# Patient Record
Sex: Male | Born: 1985
Health system: Southern US, Community
[De-identification: ages and names within clinical notes are randomized; demographics above are authoritative.]

## PROBLEM LIST (undated history)

## (undated) DIAGNOSIS — F329 Major depressive disorder, single episode, unspecified: Secondary | ICD-10-CM

## (undated) DIAGNOSIS — T7840XA Allergy, unspecified, initial encounter: Secondary | ICD-10-CM

## (undated) DIAGNOSIS — A630 Anogenital (venereal) warts: Secondary | ICD-10-CM

## (undated) DIAGNOSIS — F32A Depression, unspecified: Secondary | ICD-10-CM

## (undated) HISTORY — DX: Anogenital (venereal) warts: A63.0

## (undated) HISTORY — DX: Depression, unspecified: F32.A

## (undated) HISTORY — DX: Major depressive disorder, single episode, unspecified: F32.9

## (undated) HISTORY — DX: Allergy, unspecified, initial encounter: T78.40XA

---

## 2004-01-15 HISTORY — PX: OTHER SURGICAL HISTORY: SHX169

## 2004-02-22 ENCOUNTER — Ambulatory Visit: Payer: Self-pay | Admitting: Family Medicine

## 2004-04-20 ENCOUNTER — Ambulatory Visit: Payer: Self-pay | Admitting: Family Medicine

## 2004-08-10 ENCOUNTER — Ambulatory Visit: Payer: Self-pay | Admitting: Family Medicine

## 2004-08-17 ENCOUNTER — Ambulatory Visit: Payer: Self-pay | Admitting: Family Medicine

## 2006-06-11 ENCOUNTER — Ambulatory Visit: Payer: Self-pay | Admitting: Family Medicine

## 2006-10-16 DIAGNOSIS — G4700 Insomnia, unspecified: Secondary | ICD-10-CM | POA: Insufficient documentation

## 2006-10-16 DIAGNOSIS — L2089 Other atopic dermatitis: Secondary | ICD-10-CM | POA: Insufficient documentation

## 2006-10-16 DIAGNOSIS — Z8659 Personal history of other mental and behavioral disorders: Secondary | ICD-10-CM | POA: Insufficient documentation

## 2006-10-17 ENCOUNTER — Ambulatory Visit: Payer: Self-pay | Admitting: Professional

## 2006-10-17 ENCOUNTER — Ambulatory Visit: Payer: Self-pay | Admitting: Family Medicine

## 2006-10-17 DIAGNOSIS — F17200 Nicotine dependence, unspecified, uncomplicated: Secondary | ICD-10-CM | POA: Insufficient documentation

## 2006-10-17 DIAGNOSIS — Z87891 Personal history of nicotine dependence: Secondary | ICD-10-CM | POA: Insufficient documentation

## 2006-10-21 ENCOUNTER — Ambulatory Visit: Payer: Self-pay | Admitting: Professional

## 2006-10-24 ENCOUNTER — Ambulatory Visit: Payer: Self-pay | Admitting: Professional

## 2006-10-28 ENCOUNTER — Ambulatory Visit: Payer: Self-pay | Admitting: Professional

## 2006-11-05 ENCOUNTER — Encounter: Payer: Self-pay | Admitting: Family Medicine

## 2006-11-06 ENCOUNTER — Other Ambulatory Visit (HOSPITAL_COMMUNITY): Admission: RE | Admit: 2006-11-06 | Discharge: 2007-02-04 | Payer: Self-pay | Admitting: Psychiatry

## 2006-11-07 ENCOUNTER — Ambulatory Visit: Payer: Self-pay | Admitting: Psychiatry

## 2006-12-27 ENCOUNTER — Telehealth (INDEPENDENT_AMBULATORY_CARE_PROVIDER_SITE_OTHER): Payer: Self-pay | Admitting: *Deleted

## 2007-01-17 ENCOUNTER — Encounter: Payer: Self-pay | Admitting: Family Medicine

## 2007-04-17 DIAGNOSIS — A630 Anogenital (venereal) warts: Secondary | ICD-10-CM

## 2007-04-17 HISTORY — DX: Anogenital (venereal) warts: A63.0

## 2007-09-03 ENCOUNTER — Ambulatory Visit: Payer: Self-pay | Admitting: Family Medicine

## 2007-09-03 DIAGNOSIS — A630 Anogenital (venereal) warts: Secondary | ICD-10-CM | POA: Insufficient documentation

## 2007-09-04 ENCOUNTER — Encounter: Payer: Self-pay | Admitting: Family Medicine

## 2007-09-08 LAB — CONVERTED CEMR LAB: GC Probe Amp, Genital: NEGATIVE

## 2007-09-11 ENCOUNTER — Ambulatory Visit: Payer: Self-pay | Admitting: Family Medicine

## 2007-09-12 LAB — CONVERTED CEMR LAB

## 2007-12-02 ENCOUNTER — Ambulatory Visit: Payer: Self-pay | Admitting: Family Medicine

## 2008-05-06 ENCOUNTER — Ambulatory Visit: Payer: Self-pay | Admitting: Family Medicine

## 2008-05-06 DIAGNOSIS — M79609 Pain in unspecified limb: Secondary | ICD-10-CM | POA: Insufficient documentation

## 2008-05-07 ENCOUNTER — Encounter: Admission: RE | Admit: 2008-05-07 | Discharge: 2008-05-07 | Payer: Self-pay | Admitting: Family Medicine

## 2008-05-13 ENCOUNTER — Telehealth: Payer: Self-pay | Admitting: Family Medicine

## 2008-09-30 ENCOUNTER — Telehealth: Payer: Self-pay | Admitting: Family Medicine

## 2008-09-30 DIAGNOSIS — F319 Bipolar disorder, unspecified: Secondary | ICD-10-CM | POA: Insufficient documentation

## 2008-10-02 ENCOUNTER — Encounter: Payer: Self-pay | Admitting: Family Medicine

## 2009-04-17 ENCOUNTER — Encounter: Payer: Self-pay | Admitting: Family Medicine

## 2010-05-17 NOTE — Letter (Addendum)
Summary: Generic Letter  [place] at [facility]  [address] [place], [place] [address]   Phone: [phone]  Fax: [phone]    04/17/2009  [name] [address] [place], [place]  [address]  To whom it may concern,   My patient Noah [name] suffers from chronic attention deficit disorder. This effects his ability to concentrate and pay attention even for short periods of time.  For this reason, I feel he is (and will always be ) unable to serve jury duty.   Please contact me with any questions.    Sincerely,   Roxy Manns MD

## 2011-01-25 LAB — URINE DRUGS OF ABUSE SCREEN W ALC, ROUTINE (REF LAB)
Amphetamine Screen, Ur: NEGATIVE
Amphetamine Screen, Ur: NEGATIVE
Cocaine Metabolites: NEGATIVE
Cocaine Metabolites: NEGATIVE
Creatinine,U: 110.6
Creatinine,U: 433.1
Ethyl Alcohol: 5
Ethyl Alcohol: <5
Marijuana Metabolite: NEGATIVE
Marijuana Metabolite: POSITIVE — AB
Opiate Screen, Urine: NEGATIVE
Opiate Screen, Urine: NEGATIVE

## 2011-01-26 LAB — THC (MARIJUANA), URINE, CONFIRMATION: Marijuana, Ur-Confirmation: 67 ng/mL

## 2011-01-26 LAB — URINE DRUGS OF ABUSE SCREEN W ALC, ROUTINE (REF LAB)
Cocaine Metabolites: NEGATIVE
Creatinine,U: 242.7
Ethyl Alcohol: <5
Methadone: NEGATIVE
Opiate Screen, Urine: NEGATIVE
Phencyclidine (PCP): NEGATIVE

## 2011-01-29 LAB — URINE DRUGS OF ABUSE SCREEN W ALC, ROUTINE (REF LAB)
Amphetamine Screen, Ur: NEGATIVE
Barbiturate Quant, Ur: NEGATIVE
Barbiturate Quant, Ur: NEGATIVE
Benzodiazepines.: NEGATIVE
Benzodiazepines.: NEGATIVE
Cocaine Metabolites: NEGATIVE
Creatinine,U: 293.2
Ethyl Alcohol: <5
Marijuana Metabolite: POSITIVE — AB
Marijuana Metabolite: POSITIVE — AB
Methadone: NEGATIVE
Opiate Screen, Urine: NEGATIVE
Opiate Screen, Urine: NEGATIVE
Phencyclidine (PCP): NEGATIVE
Phencyclidine (PCP): NEGATIVE
Propoxyphene: NEGATIVE
Propoxyphene: NEGATIVE

## 2011-01-29 LAB — THC (MARIJUANA), URINE, CONFIRMATION
Marijuana, Ur-Confirmation: 130 ng/mL
Marijuana, Ur-Confirmation: 220 ng/mL

## 2011-03-01 ENCOUNTER — Encounter: Payer: Self-pay | Admitting: Family Medicine

## 2011-03-05 ENCOUNTER — Encounter: Payer: Self-pay | Admitting: Family Medicine

## 2011-03-05 ENCOUNTER — Ambulatory Visit (INDEPENDENT_AMBULATORY_CARE_PROVIDER_SITE_OTHER): Payer: BC Managed Care – PPO | Admitting: Family Medicine

## 2011-03-05 VITALS — BP 120/78 | HR 99 | Temp 98.5°F | Ht 73.0 in | Wt 171.4 lb

## 2011-03-05 DIAGNOSIS — M549 Dorsalgia, unspecified: Secondary | ICD-10-CM

## 2011-03-05 NOTE — Patient Instructions (Signed)
[email or web address]  Back pain:  Suggest moist heat: like a low level heating pad You can also use a water bottle Or a warm moist towel  A warm bath can often help Or a warm shower.  Massage is very helpful with back pain associated with muscle pain. If you have someone who lives with you who could give you a back massage, it would be a good idea.   IF YOU WANT (OK IF YOU DON'T WANT TO) Alleve 2 tabs by mouth two times a day over the counter: Take at least for 2 - 3 weeks. This is equal to a prescripton strength dose (GENERIC CHEAPER EQUIVALENT IS NAPROXEN SODIUM)

## 2011-03-05 NOTE — Progress Notes (Signed)
  Patient Name: Noah [name] Date of Birth: [birth year] Age: 25 y.o. Medical Record Number: [id number] Gender: male  History of Present Illness:  Noah [name] is a 25 y.o. very pleasant male patient who presents with the following:  About two weeks ago and woke up in the middle of the night and had a hard time to move and arch. Slowly was able to ease it down. Once hurt his back in the past and hurt a couple of days.   No meds -  No ice or heat. No numbness, no tingling. No saddle anesthesia.  Gen. he is having pain in his lumbar spine region only. No radiculopathy. No paresthesias. He has not tried any modalities, stretching, range of motion, or medications. Overall, he is in excellent health.  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   Past Medical History, Surgical History, Social History, Family History, and Problem List have been reviewed in EHR and updated if relevant.  Review of Systems:  GEN: No fevers, chills. Nontoxic. Primarily MSK c/o today. MSK: Detailed in the HPI GI: tolerating PO intake without difficulty Neuro: No numbness, parasthesias, or tingling associated. Otherwise the pertinent positives of the ROS are noted above.    Physical Examination: Filed Vitals:   03/05/11 1557  BP: 120/78  Pulse: 99  Temp: 98.5 F (36.9 C)  TempSrc: Oral  Height: 6\' 1"  (1.854 m)  Weight: 171 lb 6.4 oz (77.747 kg)  SpO2: 100%     GEN: Well-developed,well-nourished,in no acute distress; alert,appropriate and cooperative throughout examination HEENT: Normocephalic and atraumatic without obvious abnormalities. Ears, externally no deformities PULM: Breathing comfortably in no respiratory distress EXT: No clubbing, cyanosis, or edema PSYCH: Normally interactive. Cooperative during the interview. Pleasant. Friendly and conversant. Not anxious or depressed  appearing. Normal, full affect.  Range of motion at  the waist: Flexion: normal Extension: normal Lateral bending: normal Rotation: all normal  No echymosis or edema Rises to examination table with no difficulty Gait: non antalgic  Inspection/Deformity: N Paraspinus Tenderness: mild tenderness around L4-5 B  B Ankle Dorsiflexion (L5,4): 5/5 B Great Toe Dorsiflexion (L5,4): 5/5 Heel Walk (L5): WNL Toe Walk (S1): WNL Rise/Squat (L4): WNL  SENSORY B Medial Foot (L4): WNL B Dorsum (L5): WNL B Lateral (S1): WNL Light Touch: WNL Pinprick: WNL  REFLEXES Knee (L4): 2+ Ankle (S1): 2+  B SLR, seated: neg B SLR, supine: neg B FABER: neg B Reverse FABER: neg B Greater Troch: NT B Log Roll: neg B Stork: NT B Sciatic Notch: NT   Assessment and Plan: Back Pain:  I reviewed with the patient the structures involved and how they related to their diagnosis.  He would like to proceed relatively conservatively, avoid medications, think that is reasonable. Will start with heat, massage, and I reviewed core and back stability exercises with him, and gave some links to flash videos Start with medications, core rehab, and progress from there following low back pain algorithm.  No red flags are present.

## 2011-04-24 ENCOUNTER — Encounter: Payer: Self-pay | Admitting: Family Medicine

## 2011-04-24 ENCOUNTER — Ambulatory Visit (INDEPENDENT_AMBULATORY_CARE_PROVIDER_SITE_OTHER): Payer: BC Managed Care – PPO | Admitting: Family Medicine

## 2011-04-24 DIAGNOSIS — M542 Cervicalgia: Secondary | ICD-10-CM

## 2011-04-24 MED ORDER — CYCLOBENZAPRINE HCL 10 MG PO TABS: 5.0000 mg | ORAL_TABLET | Freq: Three times a day (TID) | ORAL | Status: DC | PRN

## 2011-04-24 NOTE — Progress Notes (Signed)
Got out of bed, was stretching, and felt a change in center or neck, 2 days ago. Sudden onset.  Pain in upper T spine, lower C spine.  Pain with radiation to one side or the other or neck/back with movement, but not both at the same time.  Pain up the middle of the neck to base of skull.  Pain with cough.  No motor change in hands.  No radiation into the arms.  No numbness.  No speech changes.  No weakness in arms. No weakness in legs.  He iced it and that helped some with the pain.   Had taken flexeril yesterday, ibuprofen and celebrex for the pain.  No trauma.    Meds, vitals, and allergies reviewed.   ROS: See HPI.  Otherwise, noncontributory.  nad ncat Tm wnl Op wnl Neck supple but slight dec in rom for rotation due to pain in neck, along the paraspinal muscles in lower C spine.  Trap feels tight B.  No rash in area Normal rom at shoulders and no weakness in arms or gait changes.  Sensation intact x4.  DTRs equal.

## 2011-04-24 NOTE — Patient Instructions (Signed)
Take the flexeril up to 3 times a day.  It can make you drowsy.  Take up to 600mg  of ibuprofen with food 3 times a day for pain.  Stretch you neck and alternate ice and heat over the next few days.  This should gradually get better.

## 2011-04-25 DIAGNOSIS — M542 Cervicalgia: Secondary | ICD-10-CM | POA: Insufficient documentation

## 2011-04-25 NOTE — Assessment & Plan Note (Signed)
Likely muscle strain/spasm.  Benign exam.  Use flexeril, ibuprofen with GI caution, massage, ice/heat, and stretch.  Should resolve. F/u prn.  No indication for imaging.

## 2011-06-04 ENCOUNTER — Telehealth: Payer: Self-pay | Admitting: Family Medicine

## 2011-06-04 DIAGNOSIS — F319 Bipolar disorder, unspecified: Secondary | ICD-10-CM

## 2011-06-04 DIAGNOSIS — F329 Major depressive disorder, single episode, unspecified: Secondary | ICD-10-CM

## 2011-06-04 DIAGNOSIS — F3289 Other specified depressive episodes: Secondary | ICD-10-CM

## 2011-06-04 NOTE — Telephone Encounter (Signed)
Pt's mother called- needs ref to Dr Dellia Cloud

## 2011-06-11 ENCOUNTER — Ambulatory Visit (INDEPENDENT_AMBULATORY_CARE_PROVIDER_SITE_OTHER): Payer: BC Managed Care – PPO | Admitting: Psychology

## 2011-06-11 DIAGNOSIS — F331 Major depressive disorder, recurrent, moderate: Secondary | ICD-10-CM

## 2011-06-11 DIAGNOSIS — F411 Generalized anxiety disorder: Secondary | ICD-10-CM

## 2011-06-18 ENCOUNTER — Ambulatory Visit (INDEPENDENT_AMBULATORY_CARE_PROVIDER_SITE_OTHER): Payer: BC Managed Care – PPO | Admitting: Psychology

## 2011-06-18 DIAGNOSIS — F331 Major depressive disorder, recurrent, moderate: Secondary | ICD-10-CM

## 2011-06-18 DIAGNOSIS — F341 Dysthymic disorder: Secondary | ICD-10-CM

## 2011-06-28 ENCOUNTER — Ambulatory Visit (INDEPENDENT_AMBULATORY_CARE_PROVIDER_SITE_OTHER): Payer: BC Managed Care – PPO | Admitting: Psychology

## 2011-06-28 DIAGNOSIS — F341 Dysthymic disorder: Secondary | ICD-10-CM

## 2011-06-28 DIAGNOSIS — F331 Major depressive disorder, recurrent, moderate: Secondary | ICD-10-CM

## 2011-07-05 ENCOUNTER — Ambulatory Visit (INDEPENDENT_AMBULATORY_CARE_PROVIDER_SITE_OTHER): Payer: BC Managed Care – PPO | Admitting: Psychology

## 2011-07-05 DIAGNOSIS — F331 Major depressive disorder, recurrent, moderate: Secondary | ICD-10-CM

## 2011-07-05 DIAGNOSIS — F341 Dysthymic disorder: Secondary | ICD-10-CM

## 2011-07-16 ENCOUNTER — Ambulatory Visit (INDEPENDENT_AMBULATORY_CARE_PROVIDER_SITE_OTHER): Payer: BC Managed Care – PPO | Admitting: Psychology

## 2011-07-16 DIAGNOSIS — F331 Major depressive disorder, recurrent, moderate: Secondary | ICD-10-CM

## 2011-07-16 DIAGNOSIS — F341 Dysthymic disorder: Secondary | ICD-10-CM

## 2011-09-12 ENCOUNTER — Ambulatory Visit (INDEPENDENT_AMBULATORY_CARE_PROVIDER_SITE_OTHER)
Admission: RE | Admit: 2011-09-12 | Discharge: 2011-09-12 | Disposition: A | Discharge status: Home / Self Care | Payer: BC Managed Care – PPO | Source: Ambulatory Visit | Attending: Family Medicine | Admitting: Family Medicine

## 2011-09-12 ENCOUNTER — Encounter: Payer: Self-pay | Admitting: Family Medicine

## 2011-09-12 ENCOUNTER — Ambulatory Visit (INDEPENDENT_AMBULATORY_CARE_PROVIDER_SITE_OTHER): Payer: BC Managed Care – PPO | Admitting: Family Medicine

## 2011-09-12 VITALS — BP 118/60 | HR 76 | Temp 97.5°F | Wt 168.0 lb

## 2011-09-12 DIAGNOSIS — M549 Dorsalgia, unspecified: Secondary | ICD-10-CM

## 2011-09-12 NOTE — Progress Notes (Signed)
Patient Name: Noah [name] Date of Birth: Aug 02, 1985 Age: 26 y.o. Medical Record Number: [id number] Gender: male Date of Encounter: 09/12/2011  History of Present Illness:  Noah [name] is a 26 y.o. very pleasant male patient who presents with the following:  With cutting the grass will feel like it is tight in the low part of his back. Wil feel like i tstretches out in  Will feel like he needs to pop his back, then he could not pull forward. Then bad pain in the center of his back. A back - a week and a half ago.   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 Hurts in the mid to lower portion of his back when he is bending forward and extending. Mostly hurts when he is extending.  This is different distinct from a prior time I saw him about 6 months ago when he was having some back pain, and he has been doing some rehabilitation intermittently. He did better mostly from this. No radiculopathy.  Past Medical History, Surgical History, Social History, Family History, Problem List, Medications, and Allergies have been reviewed and updated if relevant.  Review of Systems:  GEN: No fevers, chills. Nontoxic. Primarily MSK c/o today. MSK: Detailed in the HPI GI: tolerating PO intake without difficulty Neuro: No numbness, parasthesias, or tingling associated. Otherwise the pertinent positives of the ROS are noted above.    Physical Examination: Filed Vitals:   09/12/11 1506  BP: 118/60  Pulse: 76  Temp: 97.5 F (36.4 C)  TempSrc: Oral  Weight: 168 lb (76.204 kg)    There is no height on file to calculate BMI.   GEN: Well-developed,well-nourished,in no acute distress; alert,appropriate and cooperative throughout examination HEENT: Normocephalic and atraumatic without obvious abnormalities. Ears, externally no deformities PULM: Breathing comfortably in no respiratory  distress EXT: No clubbing, cyanosis, or edema PSYCH: Normally interactive. Cooperative during the interview. Pleasant. Friendly and conversant. Not anxious or depressed appearing. Normal, full affect.  Range of motion at  the waist: Flexion: normal Extension: normal - mild pain with ext Lateral bending: normal Rotation: all normal  No echymosis or edema Rises to examination table with no difficulty Gait: non antalgic  Inspection/Deformity: N Paraspinus Tenderness: mild at L3-5  B Ankle Dorsiflexion (L5,4): 5/5 B Great Toe Dorsiflexion (L5,4): 5/5 Heel Walk (L5): WNL Toe Walk (S1): WNL Rise/Squat (L4): WNL  SENSORY B Medial Foot (L4): WNL B Dorsum (L5): WNL B Lateral (S1): WNL Light Touch: WNL Pinprick: WNL  REFLEXES Knee (L4): 2+ Ankle (S1): 2+  B SLR, seated: neg B SLR, supine: neg B FABER: neg B Reverse FABER: neg B Greater Troch: NT B Log Roll: neg B Stork: NT B Sciatic Notch: NT   Assessment and Plan:  1. Back pain  DG Lumbar Spine Complete, DG Thoracic Spine W/Swimmers   Classic MSK back pain. Rule out spondylolisthesis. X-rays are normal.  Reviewed rehabilitation with the patient. [place] [facility] program  Orders Today: Orders Placed This Encounter  Procedures  . DG Lumbar Spine Complete    Standing Status: Future     Number of Occurrences: 1     Standing Expiration Date: 11/11/2012    Order Specific Question:  Preferred imaging location?    Answer:  [facility]    Order Specific Question:  Reason for exam:    Answer:  back pain  . DG Thoracic Spine W/Swimmers  Standing Status: Future     Number of Occurrences: 1     Standing Expiration Date: 11/11/2012    Order Specific Question:  Reason for exam:    Answer:  back pain    Order Specific Question:  Preferred imaging location?    Answer:  [facility]    Medications Today: No orders of the defined types were placed in this encounter.

## 2013-01-21 ENCOUNTER — Ambulatory Visit (INDEPENDENT_AMBULATORY_CARE_PROVIDER_SITE_OTHER): Payer: BC Managed Care – PPO | Admitting: Family Medicine

## 2013-01-21 ENCOUNTER — Ambulatory Visit (INDEPENDENT_AMBULATORY_CARE_PROVIDER_SITE_OTHER)
Admission: RE | Admit: 2013-01-21 | Discharge: 2013-01-21 | Disposition: A | Discharge status: Home / Self Care | Payer: BC Managed Care – PPO | Source: Ambulatory Visit | Attending: Family Medicine | Admitting: Family Medicine

## 2013-01-21 ENCOUNTER — Encounter: Payer: Self-pay | Admitting: Family Medicine

## 2013-01-21 VITALS — BP 126/76 | HR 65 | Temp 98.4°F | Ht 73.0 in | Wt 169.5 lb

## 2013-01-21 DIAGNOSIS — M25569 Pain in unspecified knee: Secondary | ICD-10-CM

## 2013-01-21 DIAGNOSIS — M25562 Pain in left knee: Secondary | ICD-10-CM

## 2013-01-21 NOTE — Progress Notes (Signed)
Nature conservation officer at [facility] [address] [place] [address] Phone: [phone] Fax: [phone]  Date:  01/21/2013   Name:  Noah [name]   DOB:  20-Feb-1986   MRN:  [id number] Gender: male Age: 27 y.o.  Primary Physician:  Roxy Manns, MD   Chief Complaint: Knee Pain   History of Present Illness:  Noah [name] is a 27 y.o. very pleasant male patient who presents with the following:  On concrete all day with his knee. Went rollerblading this past Saturday. Rollerblading. Some with skate park. Was having some L anterior and medial knee pain. Feeling better now, but does hurt some at work. No locking up. No giving way. Has not tried any meds. Feeling a little bit better over the last few days.  Past Medical History, Surgical History, Social History, Family History, Problem List, Medications, and Allergies have been reviewed and updated if relevant.  No current outpatient prescriptions on file prior to visit.   No current facility-administered medications on file prior to visit.    Review of Systems:  GEN: No fevers, chills. Nontoxic. Primarily MSK c/o today. MSK: Detailed in the HPI GI: tolerating PO intake without difficulty Neuro: No numbness, parasthesias, or tingling associated. Otherwise the pertinent positives of the ROS are noted above.    Physical Examination: BP 126/76  Pulse 65  Temp(Src) 98.4 F (36.9 C) (Oral)  Ht 6\' 1"  (1.854 m)  Wt 169 lb 8 oz (76.885 kg)  BMI 22.37 kg/m2  SpO2 98%   GEN: WDWN, NAD, Non-toxic, Alert & Oriented x 3 HEENT: Atraumatic, Normocephalic.  Ears and Nose: No external deformity. EXTR: No clubbing/cyanosis/edema NEURO: Normal gait.  PSYCH: Normally interactive. Conversant. Not depressed or anxious appearing.  Calm demeanor.   Knee:  L Gait: Normal heel toe pattern ROM: 0-130 Effusion: neg Echymosis or edema: none Patellar tendon NT Painful PLICA: neg Patellar grind: negative Medial and lateral  patellar facet loading: negative medial and lateral joint lines:NT Mcmurray's neg Flexion-pinch neg Varus and valgus stress: stable Lachman: neg Ant and Post drawer: neg Hip abduction, IR, ER: WNL Hip flexion str: 5/5 Hip abd: 5/5 Quad: 5/5 VMO atrophy:No Hamstring concentric and eccentric: 5/5 - mild TENDERNESS MEDIALLY NEAR DISTAL HS WITH ECCENTRIC STRESS  Dg Knee Complete 4 Views Left  01/21/2013   CLINICAL DATA:  Left knee pain.  EXAM: LEFT KNEE - COMPLETE 4+ VIEW  COMPARISON:  None.  FINDINGS: There is no evidence of fracture, dislocation, or joint effusion. There is no evidence of arthropathy or other focal bone abnormality. Soft tissues are unremarkable.  IMPRESSION: Negative.   Electronically Signed   By: Jerene Dilling M.D.   On: 01/21/2013 10:17    Assessment and Plan: Left knee pain - Plan: DG Knee Complete 4 Views Left   Exam fairly benign today. More like mild strain at semimembranosis or semitendinosis distally. Relative rest 2-3 weeks, NSAIDS for 2-3 weeks. He can f/u if not getting better.   Patient Instructions  Alleve 2 tabs by mouth two times a day over the counter: Take at least for 3 weeks. This is equal to a prescripton strength dose (GENERIC CHEAPER EQUIVALENT IS NAPROXEN SODIUM)   Rehab 1)  HS curls - start with 3 sets of 15 and progress to 3 sets of 30 every 3 days 2)  HS curls with weight - begin with 2 lb ankle weight when above is easy and start with 3 sets of 10; increasing every 5 days  by 5 reps - eg to 3 sets of 15 reps 3)  HS swings - swing leg backwards and curl at the end of the swing; follow same schedule as above. 4)  HS running lunges - running lunge position means no more than 45 degrees of knee flexion and running motion.  follow same schedule as above.     Signed, Elpidio Galea. Ciera Beckum, MD

## 2013-01-21 NOTE — Patient Instructions (Addendum)
Alleve 2 tabs by mouth two times a day over the counter: Take at least for 3 weeks. This is equal to a prescripton strength dose (GENERIC CHEAPER EQUIVALENT IS NAPROXEN SODIUM)   Rehab 1)  HS curls - start with 3 sets of 15 and progress to 3 sets of 30 every 3 days 2)  HS curls with weight - begin with 2 lb ankle weight when above is easy and start with 3 sets of 10; increasing every 5 days by 5 reps - eg to 3 sets of 15 reps 3)  HS swings - swing leg backwards and curl at the end of the swing; follow same schedule as above. 4)  HS running lunges - running lunge position means no more than 45 degrees of knee flexion and running motion.  follow same schedule as above.

## 2013-04-06 ENCOUNTER — Ambulatory Visit (INDEPENDENT_AMBULATORY_CARE_PROVIDER_SITE_OTHER): Payer: BC Managed Care – PPO | Admitting: Family Medicine

## 2013-04-06 ENCOUNTER — Encounter: Payer: Self-pay | Admitting: Family Medicine

## 2013-04-06 VITALS — BP 100/64 | HR 94 | Temp 98.4°F | Ht 73.0 in | Wt 168.2 lb

## 2013-04-06 DIAGNOSIS — M25569 Pain in unspecified knee: Secondary | ICD-10-CM

## 2013-04-06 DIAGNOSIS — M25562 Pain in left knee: Secondary | ICD-10-CM

## 2013-04-06 NOTE — Patient Instructions (Signed)
REFERRAL: GO THE THE FRONT ROOM AT THE ENTRANCE OF OUR CLINIC, NEAR CHECK IN. ASK FOR Noah [name]. SHE WILL HELP YOU SET UP YOUR REFERRAL. DATE: TIME:  
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
## 2013-04-06 NOTE — Progress Notes (Signed)
Pre-visit discussion using our clinic review tool. No additional management support is needed unless otherwise documented below in the visit note.  

## 2013-04-07 ENCOUNTER — Ambulatory Visit
Admission: RE | Admit: 2013-04-07 | Discharge: 2013-04-07 | Disposition: A | Discharge status: Home / Self Care | Payer: Self-pay | Source: Ambulatory Visit | Attending: Family Medicine | Admitting: Family Medicine

## 2013-04-07 ENCOUNTER — Other Ambulatory Visit: Payer: Self-pay | Admitting: Family Medicine

## 2013-04-07 ENCOUNTER — Ambulatory Visit
Admission: RE | Admit: 2013-04-07 | Discharge: 2013-04-07 | Disposition: A | Discharge status: Home / Self Care | Payer: No Typology Code available for payment source | Source: Ambulatory Visit | Attending: Family Medicine | Admitting: Family Medicine

## 2013-04-07 DIAGNOSIS — M25562 Pain in left knee: Secondary | ICD-10-CM

## 2013-04-10 ENCOUNTER — Ambulatory Visit
Admission: RE | Admit: 2013-04-10 | Discharge: 2013-04-10 | Disposition: A | Discharge status: Home / Self Care | Payer: No Typology Code available for payment source | Source: Ambulatory Visit | Attending: Family Medicine | Admitting: Family Medicine

## 2013-04-13 ENCOUNTER — Other Ambulatory Visit: Payer: Self-pay | Admitting: Family Medicine

## 2013-04-13 DIAGNOSIS — S83242A Other tear of medial meniscus, current injury, left knee, initial encounter: Secondary | ICD-10-CM

## 2013-04-13 DIAGNOSIS — M25562 Pain in left knee: Secondary | ICD-10-CM

## 2013-04-27 ENCOUNTER — Ambulatory Visit: Payer: Self-pay | Admitting: Orthopedic Surgery

## 2013-04-27 LAB — BASIC METABOLIC PANEL
ANION GAP: 4 — AB (ref 7–16)
BUN: 14 mg/dL (ref 7–18)
CALCIUM: 8.6 mg/dL (ref 8.5–10.1)
CHLORIDE: 106 mmol/L (ref 98–107)
CO2: 30 mmol/L (ref 21–32)
Creatinine: 0.86 mg/dL (ref 0.60–1.30)
EGFR (African American): >60
Glucose: 98 mg/dL (ref 65–99)
Osmolality: 280 (ref 275–301)
POTASSIUM: 3.7 mmol/L (ref 3.5–5.1)
SODIUM: 140 mmol/L (ref 136–145)

## 2013-04-27 LAB — CBC
HCT: 39.6 % — ABNORMAL LOW (ref 40.0–52.0)
HGB: 13.6 g/dL (ref 13.0–18.0)
MCH: 31.2 pg (ref 26.0–34.0)
MCHC: 34.5 g/dL (ref 32.0–36.0)
MCV: 91 fL (ref 80–100)
PLATELETS: 170 10*3/uL (ref 150–440)
RBC: 4.37 10*6/uL — AB (ref 4.40–5.90)
RDW: 13.4 % (ref 11.5–14.5)
WBC: 7.2 10*3/uL (ref 3.8–10.6)

## 2013-04-27 LAB — PROTIME-INR
INR: 1
PROTHROMBIN TIME: 13 s (ref 11.5–14.7)

## 2013-04-27 LAB — SEDIMENTATION RATE: Erythrocyte Sed Rate: 1 mm/hr (ref 0–15)

## 2013-04-27 LAB — APTT: Activated PTT: 33.5 secs (ref 23.6–35.9)

## 2013-04-30 ENCOUNTER — Ambulatory Visit: Payer: Self-pay | Admitting: Orthopedic Surgery

## 2014-02-05 ENCOUNTER — Ambulatory Visit: Payer: Self-pay | Admitting: Orthopedic Surgery

## 2014-08-07 NOTE — Op Note (Signed)
PATIENT NAME:  [name], [name] MR#:  [id number] DATE OF BIRTH:  09/24/1985  DATE OF PROCEDURE:  04/30/2013  PREOPERATIVE DIAGNOSIS:  Left knee medial meniscus tear and posterior meniscal cysts.   POSTOPERATIVE DIAGNOSIS:  Left knee medial meniscus tear and posterior meniscal cysts.   PROCEDURE:  Left knee arthroscopic partial medial meniscectomy, debridement of meniscal cysts and release of suprapatellar plica.   ANESTHESIA:  General.   SURGEON:  Kevin L. Krasinski, M.D.   COMPLICATIONS:  None.   ESTIMATED BLOOD LOSS:  Minimal.   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 He is constantly squatting and kneeling with his job. He has had he left knee pain for several month and his symptoms are affecting his job. He does have associated mechanical symptoms. An MRI has revealed an undersurface tear of the posterior horn of the medial meniscus along with posterior meniscal cyst. The patient elected to undergo a right knee arthroscopy for partial medial meniscectomy and evaluation of the posterior root and possible debridement of the meniscal cyst. I reviewed details of the procedure along with the recovery for him in my office prior to the date of surgery. I also reviewed with him the risks and benefits of surgery. He understands the risks include infection, bleeding, nerve or blood vessel injury, knee stiffness, persistent pain, re-tear of the meniscus, injury to the medial collateral ligament, osteoarthritis, persistent symptoms and the need for further surgery. He also understands that medical risks include DVT and pulmonary embolism, myocardial infarction, stroke, pneumonia, respiratory failure and death. The patient understood these risks and wished to proceed. He signed the consent in my office prior to the date of surgery.   PROCEDURE NOTE:  The patient was met in the preoperative area. His parents were by the bedside. I marked the left leg with  the word "yes" according to the hospital's right site protocol after verbally confirming with the patient that this was the correct site of surgery. I also confirmed this using his radiographic studies and my office notes.   I updated the patient's history and physical. He was then brought to the Operating Room where he was placed supine on the operative table. He was prepped and draped in sterile fashion. A time-out was performed to verify the patient's name, date of birth, medical record number, correct site of surgery and correct procedure to be performed. It was also used to verify that the patient had received antibiotics and all appropriate instruments, implants and radiographic studies were available in the room. Once all in attendance were in agreement, the case began.   An examination under anesthesia revealed full knee range of motion without any ligament instability to varus or valgus stress testing, antidepressive drawer testing and Lachman test. He has negative pivot shift.   Proposed arthroscopy incisions were drawn out with a surgical marker based upon bony landmarks. These were pre-injected with 1% lidocaine plain. An 11 blade was used to establish the inferolateral medial and lateral portals. The arthroscope was placed through the inferolateral portal and a full diagnostic examination of the knee was undertaken. This included the suprapatellar pouch, the medial and lateral gutters, the medial and lateral compartment, the intracondylar notch and the posterior knee.   Findings on arthroscopy included a large suprapatellar plica, an undersurface tear of the posterior horn of the medial meniscus, posterior meniscal cyst and an intact posterior meniscal root.   During the diagnostic phase of examination, the inferomedial   portal was created using the 18-gauge spinal needle for localization.   Once the diagnostic portion of the exam was completed and associated pictures were taken, a duckbill  straight and up-biting duckbill baskets were used to debride the posterior horn of the meniscus. A hook probe had been used to locate the undersurface of the meniscus tear. A 4.0 resector shaver blade was then used to contour the meniscus following debridement by the duckbill baskets. The tear extended into the posterior horn near the root. The attention was then turned to examining the posterior root. Both pre- and post meniscectomy probing of the meniscal was performed. This did not show any evidence of tear or rupture. There were associated meniscal cysts, which were found and debrided. Cyst fluid was seen extravasating from these areas. The knee was then lavaged. The anterior cruciate ligament was intact and probed and found to be in stable condition. There was no abnormality seen in the lateral compartment. The knee was then placed in full extension and a 90-degree ArthroCare wand was used to debride the patella plica. The knee was then lavaged and all arthroscopic instruments were removed. A 4-0 nylon was used to close the 2 arthroscopy portal incisions. Steri-Strips and a dry sterile dressing were applied. The patient was then awakened and brought to the PACU in stable condition. I was scrubbed and present for the entire case, and all sharp and instrument counts were correct at the conclusion of the case. I spoke with the patient's parents in the postoperative consultation room to let them know the case had gone without complication and the patient was stable in the Recovery Room.  ____________________________ Kevin L. Krasinski, MD [id number] D: 04/30/2013 10:50:00 ET T: 04/30/2013 11:35:24 ET JOB#: [id number]  cc: Kevin L. Krasinski, MD, <Dictator> KEVIN L KRASINSKI MD ELECTRONICALLY SIGNED 05/01/2013 13:34 

## 2014-09-01 ENCOUNTER — Ambulatory Visit (INDEPENDENT_AMBULATORY_CARE_PROVIDER_SITE_OTHER): Payer: BLUE CROSS/BLUE SHIELD | Admitting: Family Medicine

## 2014-09-01 ENCOUNTER — Encounter: Payer: Self-pay | Admitting: Family Medicine

## 2014-09-01 VITALS — BP 128/76 | HR 64 | Temp 98.5°F | Ht 73.0 in | Wt 179.5 lb

## 2014-09-01 DIAGNOSIS — Z8659 Personal history of other mental and behavioral disorders: Secondary | ICD-10-CM | POA: Diagnosis not present
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
## 2014-09-01 NOTE — Progress Notes (Signed)
Pre visit review using our clinic review tool, if applicable. No additional management support is needed unless otherwise documented below in the visit note. 

## 2014-09-01 NOTE — Patient Instructions (Signed)
I'm so glad you are doing well -good to see you  I will work on your letter and get it mailed to you  If there is anything else to do please let me know

## 2014-09-05 ENCOUNTER — Encounter: Payer: Self-pay | Admitting: Family Medicine

## 2014-09-05 NOTE — Assessment & Plan Note (Signed)
In the past this was situational and at a young age - he was hospitalized after a bad breakup with a girlfriend  With maturity/independence and a steady job that he enjoys-pt states he no longer notes any depressive symptoms No medication for years  Would like to waive restriction to own/ collect guns and ammo - pt enjoys collecting/ target shooting and reloading I see no reason why he should have restrictions at this time

## 2014-09-06 ENCOUNTER — Encounter: Payer: Self-pay | Admitting: Family Medicine

## 2014-09-29 ENCOUNTER — Ambulatory Visit (INDEPENDENT_AMBULATORY_CARE_PROVIDER_SITE_OTHER): Payer: Self-pay | Admitting: Psychology

## 2014-09-29 DIAGNOSIS — Z029 Encounter for administrative examinations, unspecified: Secondary | ICD-10-CM

## 2015-01-21 ENCOUNTER — Telehealth: Payer: Self-pay | Admitting: Family Medicine

## 2015-01-21 NOTE — Telephone Encounter (Signed)
Please mail this letter to his home / his mother Noah [name] req it  In IN box

## 2015-01-21 NOTE — Telephone Encounter (Signed)
Letter mailed

## 2015-04-20 ENCOUNTER — Ambulatory Visit (INDEPENDENT_AMBULATORY_CARE_PROVIDER_SITE_OTHER): Payer: BLUE CROSS/BLUE SHIELD | Admitting: Family Medicine

## 2015-04-20 ENCOUNTER — Ambulatory Visit (INDEPENDENT_AMBULATORY_CARE_PROVIDER_SITE_OTHER)
Admission: RE | Admit: 2015-04-20 | Discharge: 2015-04-20 | Disposition: A | Discharge status: Home / Self Care | Payer: BLUE CROSS/BLUE SHIELD | Source: Ambulatory Visit | Attending: Family Medicine | Admitting: Family Medicine

## 2015-04-20 ENCOUNTER — Encounter: Payer: Self-pay | Admitting: Family Medicine

## 2015-04-20 VITALS — BP 110/64 | HR 90 | Temp 99.7°F | Ht 73.0 in | Wt 185.8 lb

## 2015-04-20 DIAGNOSIS — J209 Acute bronchitis, unspecified: Secondary | ICD-10-CM | POA: Insufficient documentation

## 2015-04-20 DIAGNOSIS — R062 Wheezing: Secondary | ICD-10-CM | POA: Diagnosis not present

## 2015-04-20 MED ORDER — ALBUTEROL SULFATE (2.5 MG/3ML) 0.083% IN NEBU
2.5000 mg | INHALATION_SOLUTION | Freq: Once | RESPIRATORY_TRACT | Status: AC
  Administered 2015-04-20: 2.5 mg via RESPIRATORY_TRACT

## 2015-04-20 MED ORDER — ALBUTEROL SULFATE HFA 108 (90 BASE) MCG/ACT IN AERS: 2.0000 | INHALATION_SPRAY | Freq: Four times a day (QID) | RESPIRATORY_TRACT | Status: DC | PRN

## 2015-04-20 MED ORDER — BENZONATATE 200 MG PO CAPS: 200.0000 mg | ORAL_CAPSULE | Freq: Three times a day (TID) | ORAL | Status: DC | PRN

## 2015-04-20 MED ORDER — DOXYCYCLINE HYCLATE 100 MG PO TABS: 100.0000 mg | ORAL_TABLET | Freq: Two times a day (BID) | ORAL | Status: DC

## 2015-04-20 NOTE — Patient Instructions (Signed)
Nice to meet you. You likely a bronchitis. We will obtain a chest x-ray to rule out pneumonia. We will treat you with an antibiotic called doxycycline. You can use Tessalon as needed for cough. Please use the albuterol inhaler if you have wheezing or shortness of breath. If you develop shortness breath, chest pain, fevers, cough productive of blood, feel worse, or develop any new or changing symptoms please seek medical attention.

## 2015-04-20 NOTE — Progress Notes (Signed)
Patient ID: Noah [name], male   DOB: 07-23-1985, 30 y.o.   MRN: [id number]  Tommi Rumps, MD Phone: [phone]  Noah [name] is a 30 y.o. male who presents today for same-day visit.  Patient notes 3 weeks of symptoms. Notes started with cough and then developed some wheezing. Notes his symptoms got better in the second week, though then he went back to work and work was dusty. He notes his symptoms have since worsened. He notes cough is productive of brownish yellow sputum. He does note some mild shortness of breath prior to coughing that he describes as needing to gag prior to coughing and not being able to get enough breath in to cough. This does not happen every time he coughs. He has no exertional shortness of breath. He has no chest pain. He had chills last night and sweats last night. No documented fever. Notes his girlfriend is sick at home. No history of asthma. Notes minimal upper respiratory symptoms. Has only use cough drops for this.  PMH: Former smoker.   ROS see history of present illness  Objective  Physical Exam Filed Vitals:   04/20/15 1305  BP: 110/64  Pulse: 90  Temp: 99.7 F (37.6 C)    Physical Exam  Constitutional:  No acute distress, not diaphoretic, tired-appearing  HENT:  Head: Normocephalic and atraumatic.  Right Ear: External ear normal.  Left Ear: External ear normal.  Mouth/Throat: Oropharynx is clear and moist. No oropharyngeal exudate.  Eyes: Conjunctivae are normal. Pupils are equal, round, and reactive to light.  Neck: Neck supple.  Cardiovascular: Normal rate, regular rhythm and normal heart sounds.  Exam reveals no gallop and no friction rub.   No murmur heard. Pulmonary/Chest: Effort normal. No respiratory distress. He has wheezes (minimal scattered expiratory faint wheezes). He has no rales.  Lymphadenopathy:    He has no cervical adenopathy.  Neurological: He is alert. Gait normal.  Skin: Skin is warm and dry.   Patient  received an albuterol nebulizer treatment in the office and his lungs sounded clear following this.  Assessment/Plan: Please see individual problem list.  Acute bronchitis Patient's symptoms are most consistent with an acute bronchitis. Given duration of symptoms, chills and sweats, and patient feeling ill we'll treat with doxycycline. We'll also give albuterol inhaler as needed use. Tessalon for cough. Given wheezes and chills we'll obtain a chest x-ray to rule out pneumonia. Patient is given return precautions.    Orders Placed This Encounter  Procedures  . DG Chest 2 View    Standing Status: Future     Number of Occurrences: 1     Standing Expiration Date: 06/17/2016    Order Specific Question:  Reason for Exam (SYMPTOM  OR DIAGNOSIS REQUIRED)    Answer:  cough, chills    Order Specific Question:  Preferred imaging location?    Answer:  [facility] ordered this encounter  Medications  . albuterol (PROVENTIL) (2.5 MG/3ML) 0.083% nebulizer solution 2.5 mg    Sig:   . doxycycline (VIBRA-TABS) 100 MG tablet    Sig: Take 1 tablet (100 mg total) by mouth 2 (two) times daily.    Dispense:  14 tablet    Refill:  0  . albuterol (PROVENTIL HFA;VENTOLIN HFA) 108 (90 Base) MCG/ACT inhaler    Sig: Inhale 2 puffs into the lungs every 6 (six) hours as needed for wheezing or shortness of breath.    Dispense:  1 Inhaler    Refill:  0  . benzonatate (TESSALON) 200 MG capsule    Sig: Take 1 capsule (200 mg total) by mouth 3 (three) times daily as needed for cough.    Dispense:  20 capsule    Refill:  0    Dragon voice recognition software was used during the dictation process of this note. If any phrases or words seem inappropriate it is likely secondary to the translation process being inefficient.  Tommi Rumps

## 2015-04-20 NOTE — Progress Notes (Signed)
Pre visit review using our clinic review tool, if applicable. No additional management support is needed unless otherwise documented below in the visit note. 

## 2015-04-20 NOTE — Assessment & Plan Note (Signed)
Patient's symptoms are most consistent with an acute bronchitis. Given duration of symptoms, chills and sweats, and patient feeling ill we'll treat with doxycycline. We'll also give albuterol inhaler as needed use. Tessalon for cough. Given wheezes and chills we'll obtain a chest x-ray to rule out pneumonia. Patient is given return precautions.

## 2015-08-18 DIAGNOSIS — J340 Abscess, furuncle and carbuncle of nose: Secondary | ICD-10-CM | POA: Diagnosis not present

## 2015-08-18 DIAGNOSIS — J309 Allergic rhinitis, unspecified: Secondary | ICD-10-CM | POA: Diagnosis not present

## 2015-09-13 DIAGNOSIS — R21 Rash and other nonspecific skin eruption: Secondary | ICD-10-CM | POA: Diagnosis not present

## 2017-07-22 ENCOUNTER — Other Ambulatory Visit: Payer: Self-pay

## 2017-07-22 ENCOUNTER — Encounter: Payer: Self-pay | Admitting: Family Medicine

## 2017-07-22 ENCOUNTER — Ambulatory Visit: Payer: BLUE CROSS/BLUE SHIELD | Admitting: Family Medicine

## 2017-07-22 VITALS — BP 104/80 | HR 92 | Temp 98.5°F | Ht 71.5 in | Wt 200.8 lb

## 2017-07-22 DIAGNOSIS — Z23 Encounter for immunization: Secondary | ICD-10-CM

## 2017-07-22 DIAGNOSIS — M659 Synovitis and tenosynovitis, unspecified: Secondary | ICD-10-CM | POA: Diagnosis not present

## 2017-07-22 DIAGNOSIS — M25512 Pain in left shoulder: Secondary | ICD-10-CM

## 2017-07-22 NOTE — Patient Instructions (Signed)
AC joint inflammation on the left (shoulder) Left hand has flexor tenosynovitis (fluid in the sausage casing)   Motrin 600 - 800 mg recommended TID. (Over the counter Motrin, Advil, or Generic Ibuprofen 200 mg tablets. 3-4 tablets by mouth 3 times a day. This equals a prescription strength dose.)   OR  Alleve 2 tabs by mouth two times a day over the counter: Take at least for 2 - 3 weeks. This is equal to a prescripton strength dose (GENERIC CHEAPER EQUIVALENT IS NAPROXEN SODIUM)
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
## 2017-08-05 ENCOUNTER — Ambulatory Visit (INDEPENDENT_AMBULATORY_CARE_PROVIDER_SITE_OTHER)
Admission: RE | Admit: 2017-08-05 | Discharge: 2017-08-05 | Disposition: A | Discharge status: Home / Self Care | Payer: BLUE CROSS/BLUE SHIELD | Source: Ambulatory Visit | Attending: Family Medicine | Admitting: Family Medicine

## 2017-08-05 ENCOUNTER — Other Ambulatory Visit: Payer: Self-pay

## 2017-08-05 ENCOUNTER — Ambulatory Visit: Payer: BLUE CROSS/BLUE SHIELD | Admitting: Family Medicine

## 2017-08-05 ENCOUNTER — Encounter: Payer: Self-pay | Admitting: Family Medicine

## 2017-08-05 VITALS — BP 90/66 | HR 79 | Temp 98.8°F | Ht 71.5 in | Wt 203.5 lb

## 2017-08-05 DIAGNOSIS — M7542 Impingement syndrome of left shoulder: Secondary | ICD-10-CM | POA: Diagnosis not present

## 2017-08-05 DIAGNOSIS — M7552 Bursitis of left shoulder: Secondary | ICD-10-CM

## 2017-08-05 DIAGNOSIS — M25512 Pain in left shoulder: Secondary | ICD-10-CM

## 2017-08-05 MED ORDER — PREDNISONE 20 MG PO TABS: ORAL_TABLET | ORAL | 0 refills | Status: DC
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
## 2017-08-05 NOTE — Patient Instructions (Signed)

## 2017-08-06 DIAGNOSIS — M7542 Impingement syndrome of left shoulder: Secondary | ICD-10-CM | POA: Diagnosis not present

## 2017-08-06 DIAGNOSIS — M25512 Pain in left shoulder: Secondary | ICD-10-CM | POA: Diagnosis not present

## 2017-08-15 DIAGNOSIS — M7542 Impingement syndrome of left shoulder: Secondary | ICD-10-CM | POA: Diagnosis not present

## 2017-08-15 DIAGNOSIS — M25512 Pain in left shoulder: Secondary | ICD-10-CM | POA: Diagnosis not present

## 2017-08-22 DIAGNOSIS — M25512 Pain in left shoulder: Secondary | ICD-10-CM | POA: Diagnosis not present

## 2017-08-22 DIAGNOSIS — M7542 Impingement syndrome of left shoulder: Secondary | ICD-10-CM | POA: Diagnosis not present

## 2017-08-29 DIAGNOSIS — M7542 Impingement syndrome of left shoulder: Secondary | ICD-10-CM | POA: Diagnosis not present

## 2017-08-29 DIAGNOSIS — M25512 Pain in left shoulder: Secondary | ICD-10-CM | POA: Diagnosis not present

## 2017-09-03 DIAGNOSIS — M25512 Pain in left shoulder: Secondary | ICD-10-CM | POA: Diagnosis not present

## 2017-09-03 DIAGNOSIS — M7542 Impingement syndrome of left shoulder: Secondary | ICD-10-CM | POA: Diagnosis not present

## 2017-09-11 ENCOUNTER — Encounter: Payer: Self-pay | Admitting: Family Medicine

## 2017-09-11 ENCOUNTER — Ambulatory Visit: Payer: BLUE CROSS/BLUE SHIELD | Admitting: Family Medicine

## 2017-09-11 VITALS — BP 106/70 | HR 84 | Temp 99.0°F | Ht 71.5 in | Wt 202.5 lb

## 2017-09-11 DIAGNOSIS — S43432A Superior glenoid labrum lesion of left shoulder, initial encounter: Secondary | ICD-10-CM

## 2017-09-11 DIAGNOSIS — M7542 Impingement syndrome of left shoulder: Secondary | ICD-10-CM | POA: Diagnosis not present

## 2017-09-11 DIAGNOSIS — M25512 Pain in left shoulder: Secondary | ICD-10-CM

## 2017-09-11 DIAGNOSIS — M7552 Bursitis of left shoulder: Secondary | ICD-10-CM | POA: Diagnosis not present

## 2017-09-11 MED ORDER — METHYLPREDNISOLONE ACETATE 40 MG/ML IJ SUSP
80.0000 mg | Freq: Once | INTRAMUSCULAR | Status: AC
  Administered 2017-09-11: 80 mg via INTRA_ARTICULAR
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
## 2017-09-23 ENCOUNTER — Telehealth: Payer: Self-pay | Admitting: Family Medicine

## 2017-09-23 NOTE — Telephone Encounter (Signed)
LMOM and I spoke to the patient directly.

## 2017-09-23 NOTE — Telephone Encounter (Signed)
Patients mother Noah [name] was here in person for an appointment and asked me to put a phone note in for her son Noah [name]. Patients shoulder isnt any better, please call patients mother Noah [name] at [phone]. Please call her after 12:30 today.

## 2017-09-23 NOTE — Addendum Note (Signed)
Addended by: Owens Loffler on: 09/23/2017 04:17 PM   Modules accepted: Orders

## 2017-09-26 ENCOUNTER — Ambulatory Visit
Admission: RE | Admit: 2017-09-26 | Discharge: 2017-09-26 | Disposition: A | Discharge status: Home / Self Care | Payer: BLUE CROSS/BLUE SHIELD | Source: Ambulatory Visit | Attending: Family Medicine | Admitting: Family Medicine

## 2017-09-26 DIAGNOSIS — R600 Localized edema: Secondary | ICD-10-CM | POA: Diagnosis not present

## 2017-09-26 DIAGNOSIS — M7552 Bursitis of left shoulder: Secondary | ICD-10-CM

## 2017-09-26 DIAGNOSIS — M25512 Pain in left shoulder: Secondary | ICD-10-CM

## 2017-09-26 DIAGNOSIS — M7542 Impingement syndrome of left shoulder: Secondary | ICD-10-CM

## 2017-09-27 ENCOUNTER — Encounter: Payer: Self-pay | Admitting: Family Medicine

## 2017-09-27 NOTE — Progress Notes (Signed)
ok 

## 2017-09-27 NOTE — Addendum Note (Signed)
Addended by: Owens Loffler on: 09/27/2017 11:25 AM   Modules accepted: Orders

## 2017-09-27 NOTE — Addendum Note (Signed)
Addended by: Owens Loffler on: 09/27/2017 11:17 AM   Modules accepted: Orders

## 2017-10-09 DIAGNOSIS — M25812 Other specified joint disorders, left shoulder: Secondary | ICD-10-CM | POA: Diagnosis not present

## 2017-10-30 DIAGNOSIS — M24812 Other specific joint derangements of left shoulder, not elsewhere classified: Secondary | ICD-10-CM | POA: Diagnosis not present

## 2017-11-06 ENCOUNTER — Ambulatory Visit: Payer: Self-pay | Admitting: Family Medicine

## 2017-11-06 ENCOUNTER — Ambulatory Visit: Payer: BLUE CROSS/BLUE SHIELD | Admitting: Family Medicine

## 2017-11-06 ENCOUNTER — Encounter: Payer: Self-pay | Admitting: Family Medicine

## 2017-11-06 VITALS — BP 116/78 | HR 83 | Temp 98.2°F | Ht 71.5 in | Wt 199.5 lb

## 2017-11-06 DIAGNOSIS — L2082 Flexural eczema: Secondary | ICD-10-CM | POA: Diagnosis not present

## 2017-11-06 DIAGNOSIS — S70261A Insect bite (nonvenomous), right hip, initial encounter: Secondary | ICD-10-CM | POA: Diagnosis not present

## 2017-11-06 DIAGNOSIS — F17200 Nicotine dependence, unspecified, uncomplicated: Secondary | ICD-10-CM | POA: Diagnosis not present

## 2017-11-06 DIAGNOSIS — W57XXXA Bitten or stung by nonvenomous insect and other nonvenomous arthropods, initial encounter: Secondary | ICD-10-CM | POA: Diagnosis not present

## 2017-11-06 DIAGNOSIS — L309 Dermatitis, unspecified: Secondary | ICD-10-CM | POA: Insufficient documentation

## 2017-11-06 MED ORDER — DOXYCYCLINE HYCLATE 100 MG PO TABS: 100.0000 mg | ORAL_TABLET | Freq: Two times a day (BID) | ORAL | 0 refills | Status: DC

## 2017-11-06 MED ORDER — TRIAMCINOLONE ACETONIDE 0.1 % EX CREA: 1.0000 "application " | TOPICAL_CREAM | Freq: Two times a day (BID) | CUTANEOUS | 3 refills | Status: DC

## 2017-11-06 NOTE — Patient Instructions (Signed)
Keep the tick bite clean with soap and water  A cold compress may help also with itching   Take the doxycycline as directed   I think you have eczema - on your arms  For flares-use the triamcinolone cream twice daily   Stay away from very hot water and harsh detergents  Use dove soap for sensitive skin  Moisturize with non scented lotions or creams - eucerin/ lubriderm/cerave  Store brand is fine   If you develop a fever or joint pain or swelling or a new rash please let me know
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
## 2017-11-08 NOTE — Assessment & Plan Note (Signed)
Disc in detail risks of smoking and possible outcomes including copd, vascular/ heart disease, cancer , respiratory and sinus infections  Pt voices understanding He is not ready to quit  

## 2017-11-08 NOTE — Assessment & Plan Note (Signed)
Appearance - some central clearing/ cannot r/o erythema migrans  Sounds like it was a deer tick based on size  No retained tick parts Will tx with doxycycline  Alert if fever/ new rash or joint pain -or if no improvement  Disc use of insect repellent with deet

## 2017-11-08 NOTE — Assessment & Plan Note (Signed)
On arms -comes and goes Recommend avoidance of hot water and harsh detergents Triamcinolone cream px for prn use Handout given

## 2018-03-26 DIAGNOSIS — T1512XA Foreign body in conjunctival sac, left eye, initial encounter: Secondary | ICD-10-CM | POA: Diagnosis not present

## 2018-04-18 ENCOUNTER — Encounter: Payer: Self-pay | Admitting: Family Medicine

## 2018-04-18 ENCOUNTER — Ambulatory Visit: Payer: BLUE CROSS/BLUE SHIELD | Admitting: Family Medicine

## 2018-04-18 VITALS — BP 132/80 | HR 75 | Temp 98.1°F | Ht 71.5 in | Wt 201.5 lb

## 2018-04-18 DIAGNOSIS — R61 Generalized hyperhidrosis: Secondary | ICD-10-CM

## 2018-04-18 DIAGNOSIS — F17200 Nicotine dependence, unspecified, uncomplicated: Secondary | ICD-10-CM

## 2018-04-18 MED ORDER — ALUMINUM CHLORIDE 20 % EX SOLN: Freq: Every day | CUTANEOUS | 5 refills | Status: DC

## 2018-04-18 MED ORDER — VARENICLINE TARTRATE 1 MG PO TABS: 1.0000 mg | ORAL_TABLET | Freq: Two times a day (BID) | ORAL | 3 refills | Status: DC

## 2018-04-18 MED ORDER — VARENICLINE TARTRATE 0.5 MG X 11 & 1 MG X 42 PO MISC: ORAL | 0 refills | Status: DC
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
## 2018-04-18 NOTE — Patient Instructions (Signed)
Try the drysol solution - apply to affected areas at bedtime and then wash it off in the am  Once improved- you can use 2-3 times weekly  Let me know if no improvement after several weeks   The gold bond powder is ok to use during the day   Start the chantix starter pack - and stop smoking when you are ready  Take the maintenance dose after starter pack  Take with food   If any intolerable side effects let me know

## 2018-04-20 NOTE — Assessment & Plan Note (Signed)
In genital/inguinal area  Px drysol solution to use nightly and then titrate to 2-3 times per week if helpful  Disc use of boxer briefs  Update if no improvement  Gold bond powder prn  Watch for redness /signs of tinea

## 2018-04-20 NOTE — Assessment & Plan Note (Signed)
Disc in detail risks of smoking and possible outcomes including copd, vascular/ heart disease, cancer , respiratory and sinus infections  Pt voices understanding Motivated to quit  Disc use of chantix Disc possible side eff like mood change and vivid dreams or nausea  inst to hold it and call if intol side eff  Px starter pack and continuing dose with inst

## 2018-07-09 IMAGING — MR MR SHOULDER*L* W/O CM
5 series · 35 of 40 positions shown · non-contrast
Comparison: Left shoulder x-rays dated August 05, 2017.

CLINICAL DATA: Persistent left shoulder pain after heavy shoveling
3 months ago.

EXAM:
MRI OF THE LEFT SHOULDER WITHOUT CONTRAST
TECHNIQUE: Multiplanar, multisequence MR imaging of the shoulder was performed.
No intravenous contrast was administered.

[Series 5: T2 fat-sat · axial · 4.0mm · 0.44mm/px · z∈[-57,+21]mm · 9 of 18 slices shown (1 of 3)]
[im 1/18]
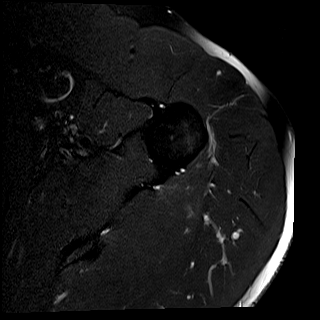
[im 3/18]
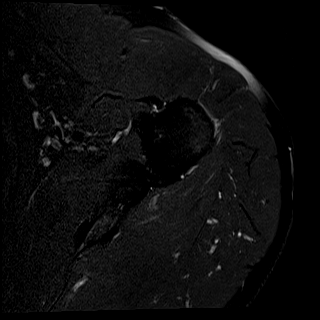
[im 5/18]
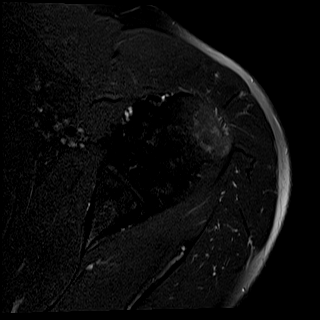
[im 7/18]
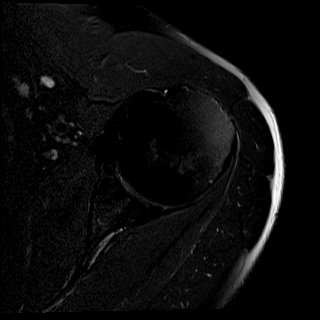
[im 9/18]
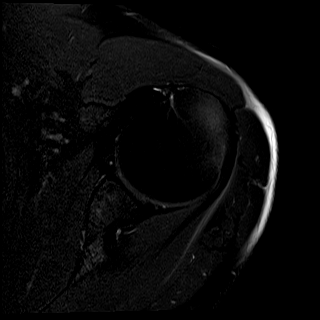
[im 11/18]
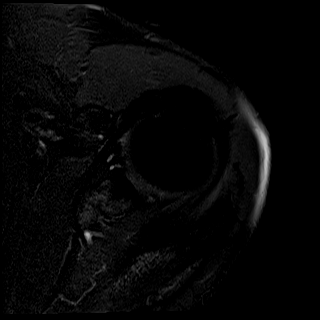
[im 13/18]
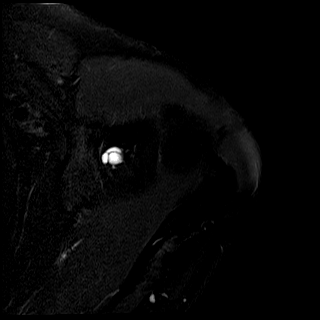
[im 15/18]
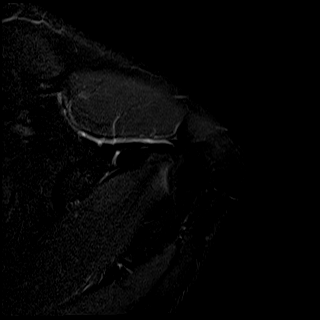
[im 18/18]
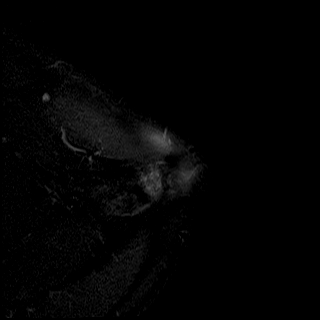

[Series 6: PD · oblique · 4.0mm · 0.50mm/px · 8 of 16 slices shown]
[im 1/16]
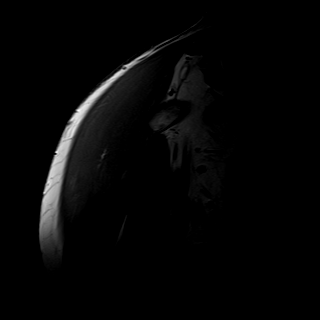
[im 3/16]
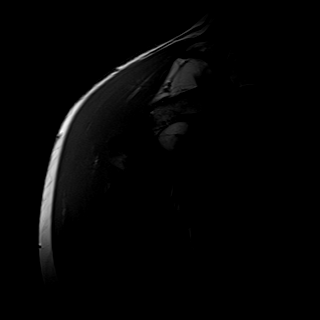
[im 5/16]
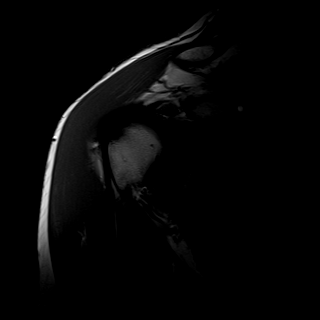
[im 7/16]
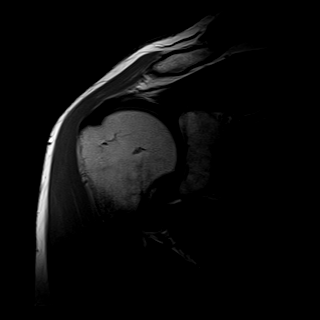
[im 9/16]
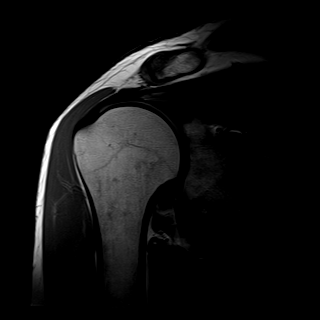
[im 11/16]
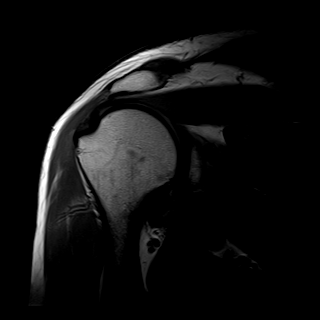
[im 13/16]
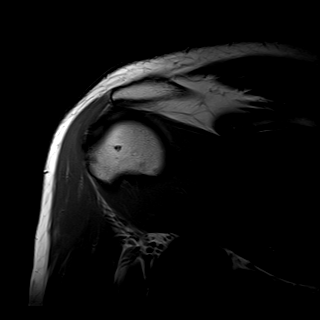
[im 16/16]
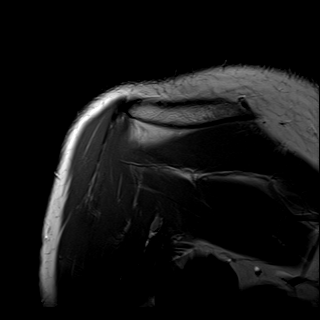

[Series 7: T2 fat-sat · oblique · 4.0mm · 0.50mm/px · 7 of 16 slices shown (2 of 3)]
[im 1/16]
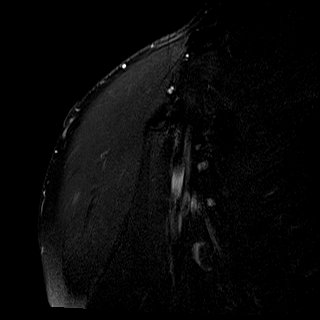
[im 3/16]
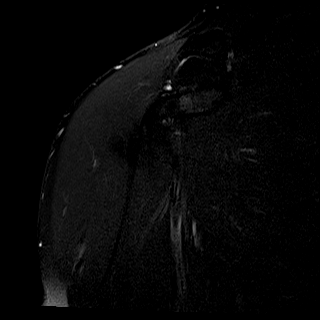
[im 6/16]
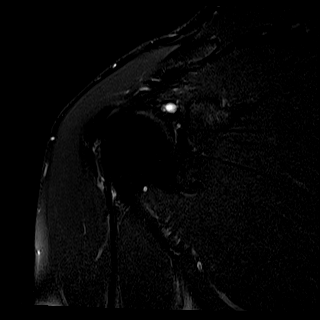
[im 8/16]
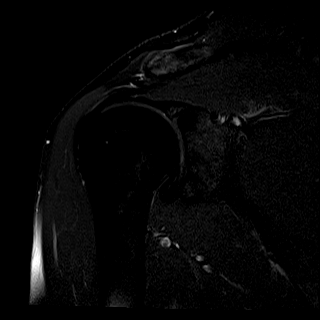
[im 11/16]
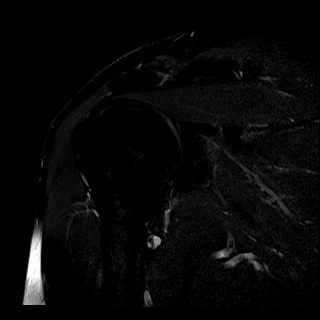
[im 13/16]
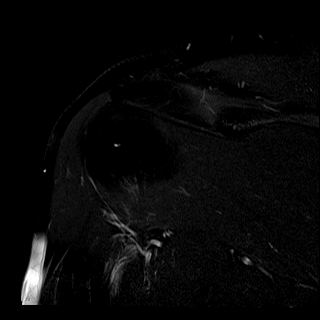
[im 16/16]
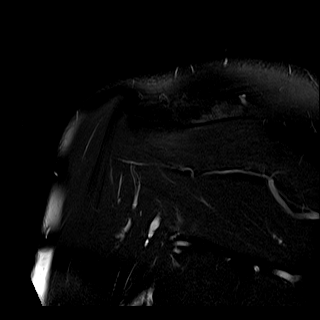

[Series 8: T2 fat-sat · oblique · 4.0mm · 0.50mm/px · 8 of 18 slices shown (3 of 3)]
[im 1/18]
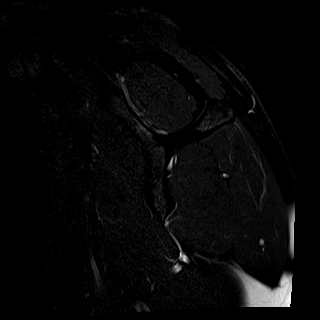
[im 3/18]
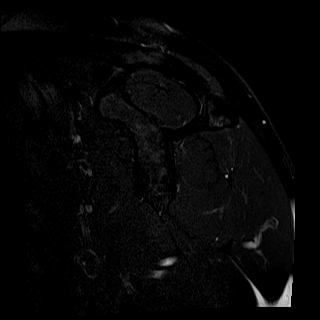
[im 5/18]
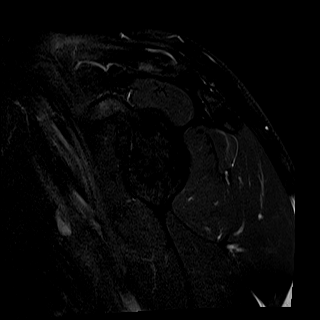
[im 8/18]
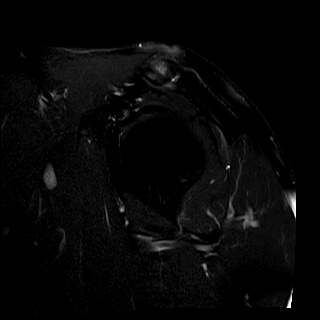
[im 10/18]
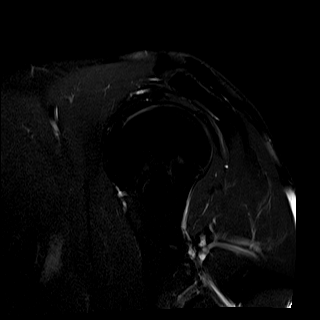
[im 13/18]
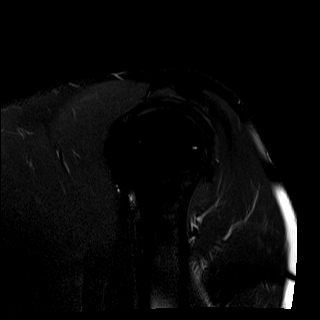
[im 15/18]
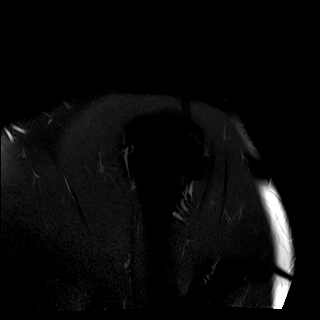
[im 18/18]
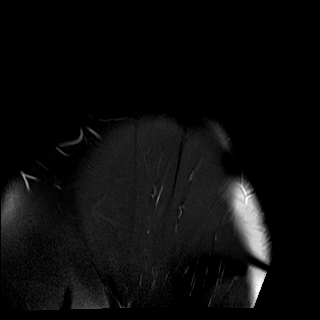

[Series 9: T1 · oblique · 4.0mm · 0.25mm/px · 3 of 18 slices shown]
[im 1/18]
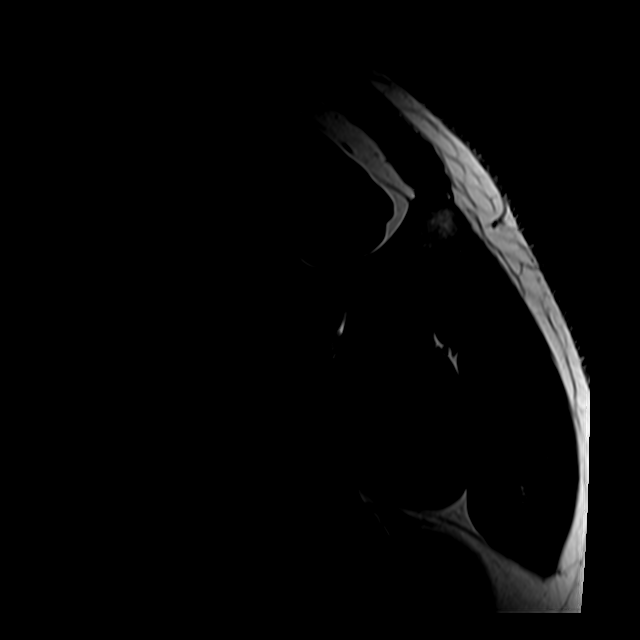
[im 3/18]
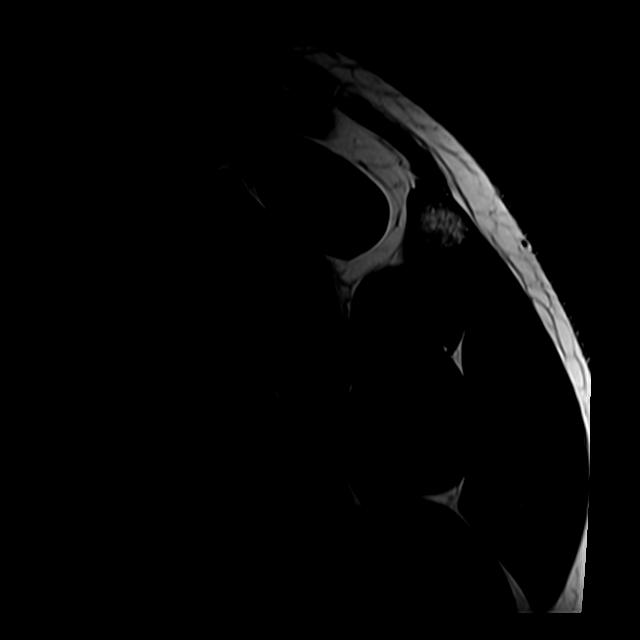
[im 5/18]
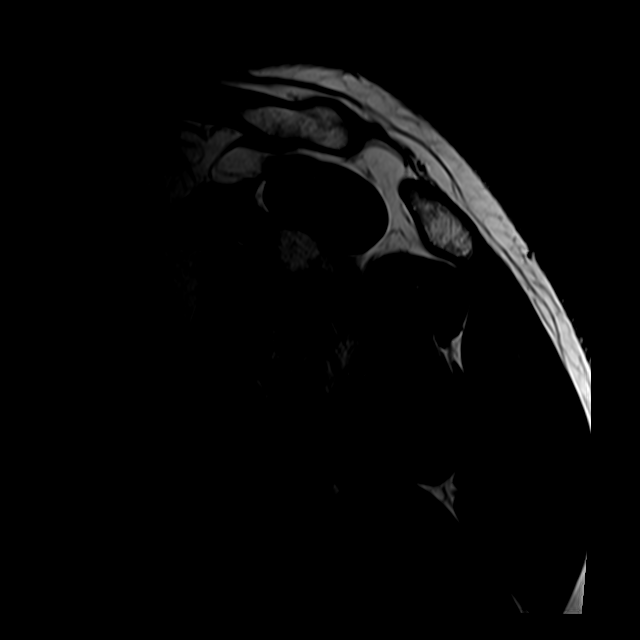

[35 of 40 positions shown; findings below may reference images not displayed]

FINDINGS: Rotator cuff:  Intact rotator cuff.

Muscles: No atrophy or abnormal signal of the muscles of the rotator
cuff.

Biceps long head:  Intact and normally positioned.

Acromioclavicular Joint: There is focal marrow edema and mild
periostitis with a small erosion involving the distal clavicle only.
The acromion is unremarkable. No fluid within the acromioclavicular
joint. Type I acromion. No subacromial/subdeltoid bursal fluid.

Glenohumeral Joint: No joint effusion. No chondral defect.

Labrum: There is a small 8 x 10 x 6 mm loculated cystic lesion
adjacent to the anterior superior labrum, suspicious for paralabral
cyst with underlying labral tear. Abnormal signal extending into the
superior and posterosuperior labrum (series 7, image 8).

Bones: Signal changes in the distal clavicle as described above. No
fracture or dislocation. No focal bone lesion.

Other: None.
IMPRESSION: 1. Findings suspicious for superior and posterosuperior labral tear.
Small 10 mm loculated cystic lesion adjacent to the anterior
superior labrum likely represents a paralabral cyst, which may
reflect extension of the labral tear into the anterior superior
labrum.
2. Focal marrow edema with small erosion involving the distal
clavicle only. The acromion is unremarkable. Findings are suspicious
for distal clavicle osteolysis.

## 2018-08-18 ENCOUNTER — Ambulatory Visit (INDEPENDENT_AMBULATORY_CARE_PROVIDER_SITE_OTHER): Payer: BLUE CROSS/BLUE SHIELD | Admitting: Family Medicine

## 2018-08-18 ENCOUNTER — Other Ambulatory Visit: Payer: Self-pay

## 2018-08-18 ENCOUNTER — Encounter: Payer: Self-pay | Admitting: Family Medicine

## 2018-08-18 DIAGNOSIS — J029 Acute pharyngitis, unspecified: Secondary | ICD-10-CM | POA: Diagnosis not present

## 2018-08-18 DIAGNOSIS — Z87891 Personal history of nicotine dependence: Secondary | ICD-10-CM

## 2018-08-18 DIAGNOSIS — R0683 Snoring: Secondary | ICD-10-CM

## 2018-08-18 MED ORDER — AMOXICILLIN 500 MG PO CAPS: 500.0000 mg | ORAL_CAPSULE | Freq: Two times a day (BID) | ORAL | 0 refills | Status: DC

## 2018-08-18 NOTE — Assessment & Plan Note (Signed)
With frequent waking as well as ST and fatigue  He is a candidate for sleep apnea (may have gained wt since quitting smoking as well)  Will work on wt loss We will treat ST/pharyngitis If no imp consider ENT/sleep eval

## 2018-08-18 NOTE — Patient Instructions (Signed)
Take amoxicillin as directed for throat  Gargle with salt water Aleve twice daily (with food) may help Call in 1 week-if not improved we may want to try prednisone and refer you to ENT  Work hard on weight loss-if this does not help snoring/sleep we need to consider a sleep evaluation   Let me know if cough/fever or other symptoms also

## 2018-08-18 NOTE — Assessment & Plan Note (Signed)
Commended on smoking cessation  Had uri 3 mo ago and quit on his own/ did not use chantix  Now still mild cough /no phlegm and occ chest wall fleeting pain but much improved  Will continue to watch
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
## 2018-08-18 NOTE — Assessment & Plan Note (Signed)
This may be multifactorial Pt had a bad uri 3 mo ago-has had ST ever since  May have inflammation or infection  Will cover with amoxicillin (cannot have office visit due to the pandemic to get a swab)  If no improvement-may consider prednisone and ENT referral  Also discussed loud snoring and fatigue- this may be creating throat trauma as well Pt will work on weight loss the best he can May need sleep eval (ENT or pulm) in the future He will update [place] in a week

## 2018-08-27 ENCOUNTER — Encounter: Payer: Self-pay | Admitting: Family Medicine

## 2018-08-27 ENCOUNTER — Ambulatory Visit (INDEPENDENT_AMBULATORY_CARE_PROVIDER_SITE_OTHER): Payer: BLUE CROSS/BLUE SHIELD | Admitting: Family Medicine

## 2018-08-27 DIAGNOSIS — R0683 Snoring: Secondary | ICD-10-CM

## 2018-08-27 DIAGNOSIS — J029 Acute pharyngitis, unspecified: Secondary | ICD-10-CM
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
## 2018-08-27 NOTE — Assessment & Plan Note (Signed)
Overall much improved - after tx with amoxicillin Still mildly sore Thinks his tonsils may be swollen No fever or swollen lymph nodes Still snoring loudly and not sleeping well  Will ref to ENT for further eval

## 2018-08-27 NOTE — Assessment & Plan Note (Signed)
Ongoing-now with an episode of witnessed apnea  The snoring wakes him up  He also gets up feeling un rested  Suspect he may have sleep apnea  He has started working on weight loss  Is interested in specialty appt for snoring as well as mild sore throat  He would like more info on mouth appliances that help snoring and would also be open to a sleep study if needed

## 2018-08-27 NOTE — Patient Instructions (Signed)
I think your sore throat (glad it is improved) and loud snoring with witnessed breath holding deserve a specialist opinion You may have sleep apnea  Keep working on weight loss Sleep propped up if it helps Watch carefully for elevated temperature (fever) body aches or other symptoms  Also if sore throat becomes worse  I will work on an ENT referral and the office will call you about that

## 2018-09-04 DIAGNOSIS — G4733 Obstructive sleep apnea (adult) (pediatric): Secondary | ICD-10-CM | POA: Diagnosis not present

## 2018-09-09 ENCOUNTER — Telehealth: Payer: Self-pay

## 2018-09-09 NOTE — Telephone Encounter (Signed)
Mrs [facility] signed) said pt was seen virtual visit on 08/27/18.Marland Kitchen pt finished abx and still has no energy.pt saw ENT and is going to have sleep study done; not scheduled yet. [name] is adopted and Mrs Douthit does not know a lot about pts biological family hx. Dr and Mrs Meister concerned about possible diabetes,thyroid issues or mono. Pt has gained wt and is overwt per Mrs Kuhl. [name]'s mom was 1/4 [employer] [place] and Mrs Furry wonders if that predisposes [name] to higher chance of diabetes. Mrs Parish wants to know if [name] could have some blood testing for diabetes, thyroid issues and mono. Mrs Lindon request cb from Dr Glori Bickers.

## 2018-09-09 NOTE — Telephone Encounter (Signed)
Spoke to Noah [name].  As long as he no longer has a sore throat or any respiratory symptoms he can schedule a visit to be seen.  I would like to get labs on him as well so that sounds like a good idea.  Per her- the ENT did not find anything concerning about his throat and that got better.  I encouraged him to go forward with the sleep study which he plans to do  They will call for an appt.

## 2018-10-02 ENCOUNTER — Ambulatory Visit: Payer: BC Managed Care – PPO | Attending: Otolaryngology

## 2018-10-02 DIAGNOSIS — G4733 Obstructive sleep apnea (adult) (pediatric): Secondary | ICD-10-CM | POA: Diagnosis not present

## 2018-10-03 ENCOUNTER — Other Ambulatory Visit: Payer: Self-pay

## 2018-11-21 ENCOUNTER — Telehealth: Payer: Self-pay | Admitting: Family Medicine

## 2018-11-21 NOTE — Telephone Encounter (Signed)
Bonnita Nasuti called and asked for Dr.Tower to call her back.  Bonnita Nasuti said patient was referred to Dr.McQueen.  Bonnita Nasuti said the sleep study was done on 10/02/18.  They finally got the results this past Wednesday, after several attempts to get it.  Patient was suppose to receive the CPap machine at least 5 weeks ago.  Patient  has called several times trying to get the machine. Bonnita Nasuti wanted to know if Dr.Tower has received the sleep study report. Bonnita Nasuti is very upset with the service she has received at Dr.McQueen's office. Bonnita Nasuti said they gave patient oxygen twice during the sleep study and she's very worried about him not having the machine. Bonnita Nasuti said Dr. Glori Bickers can call her on Monday.

## 2018-11-21 NOTE — Telephone Encounter (Signed)
I can see his sleep study interpretation under media tab (looks like it was read by Dr Richardson Landry)  Could you please call the ENT office and see if they were planning to order cpap (it was recommended) or if I need to do that?   Thanks !  I am sending this to [facility] and cc to [employer]

## 2018-11-21 NOTE — Telephone Encounter (Signed)
[facility] and they are closed no one answered phone, will f/u on Monday

## 2018-11-24 ENCOUNTER — Telehealth: Payer: Self-pay | Admitting: Family Medicine

## 2018-11-24 DIAGNOSIS — G4730 Sleep apnea, unspecified: Secondary | ICD-10-CM | POA: Insufficient documentation

## 2018-11-24 DIAGNOSIS — Z13220 Encounter for screening for lipoid disorders: Secondary | ICD-10-CM

## 2018-11-24 DIAGNOSIS — Z9989 Dependence on other enabling machines and devices: Secondary | ICD-10-CM | POA: Insufficient documentation

## 2018-11-24 DIAGNOSIS — R5383 Other fatigue: Secondary | ICD-10-CM | POA: Insufficient documentation

## 2018-11-24 DIAGNOSIS — R5382 Chronic fatigue, unspecified: Secondary | ICD-10-CM

## 2018-11-24 DIAGNOSIS — G4733 Obstructive sleep apnea (adult) (pediatric): Secondary | ICD-10-CM | POA: Insufficient documentation

## 2018-11-24 NOTE — Telephone Encounter (Signed)
Message left on my machine by Bonnita Nasuti, requesting an appointment with Dr Glori Bickers regarding Noah [name] current fatigue. She would like him to have some lab work done. She isnt sure about a Physical, she is saying he has extreme fatigue, please advise and call patient to schedule what patient needs to have.

## 2018-11-24 NOTE — Telephone Encounter (Signed)
Please schedule a fasting blood draw followed by in office or virtual appt  Send this back to me and I will order labs

## 2018-11-24 NOTE — Telephone Encounter (Signed)
Patient scheduled lab appointment on 11/25/18 at 4:00 and appointment with Dr.Tower on 11/26/18 at 4:00.

## 2018-11-24 NOTE — Telephone Encounter (Signed)
Called ENT office and they  said they called the patient last Friday to discuss the CPAP machine. Called Noah [name] and she is aware that [facility] did contact Noah [name] about the CPAP machine. There was an erroneous error that Noah [name] said they had the name incorrect somehow and that was why they did not call patient to set up the CPAP. Family not happy about the care they received there. Dr Glori Bickers does not need to call Noah [name] back regarding this matter.

## 2018-11-25 ENCOUNTER — Other Ambulatory Visit (INDEPENDENT_AMBULATORY_CARE_PROVIDER_SITE_OTHER): Payer: BC Managed Care – PPO

## 2018-11-25 ENCOUNTER — Telehealth: Payer: Self-pay | Admitting: Family Medicine

## 2018-11-25 DIAGNOSIS — R5382 Chronic fatigue, unspecified: Secondary | ICD-10-CM

## 2018-11-25 DIAGNOSIS — G4733 Obstructive sleep apnea (adult) (pediatric): Secondary | ICD-10-CM

## 2018-11-25 DIAGNOSIS — Z13220 Encounter for screening for lipoid disorders: Secondary | ICD-10-CM | POA: Diagnosis not present

## 2018-11-25 NOTE — Telephone Encounter (Signed)
done

## 2018-11-25 NOTE — Telephone Encounter (Signed)
Spoke with patients father Dr Teresa Pelton he requested if we could refer Noah [name] to a [facility] MD so patient could get his CPAP machine ordered through [facility]. Sleep Study results are in [place]. Called [facility] and scheduled appointment with Dr Ashby Dawes for 12/01/18 at 4:15pm, patients father notified of appointment.

## 2018-11-26 ENCOUNTER — Encounter: Payer: Self-pay | Admitting: Family Medicine

## 2018-11-26 ENCOUNTER — Ambulatory Visit (INDEPENDENT_AMBULATORY_CARE_PROVIDER_SITE_OTHER): Payer: BC Managed Care – PPO | Admitting: Family Medicine

## 2018-11-26 ENCOUNTER — Other Ambulatory Visit: Payer: Self-pay

## 2018-11-26 VITALS — BP 146/90 | HR 84 | Temp 97.9°F | Ht 71.5 in | Wt 213.5 lb

## 2018-11-26 DIAGNOSIS — E039 Hypothyroidism, unspecified: Secondary | ICD-10-CM | POA: Diagnosis not present

## 2018-11-26 DIAGNOSIS — G4733 Obstructive sleep apnea (adult) (pediatric): Secondary | ICD-10-CM

## 2018-11-26 DIAGNOSIS — E538 Deficiency of other specified B group vitamins: Secondary | ICD-10-CM

## 2018-11-26 DIAGNOSIS — R5382 Chronic fatigue, unspecified: Secondary | ICD-10-CM

## 2018-11-26 LAB — COMPREHENSIVE METABOLIC PANEL
ALT: 29 U/L (ref 0–53)
AST: 28 U/L (ref 0–37)
Albumin: 4.9 g/dL (ref 3.5–5.2)
Alkaline Phosphatase: 69 U/L (ref 39–117)
BUN: 19 mg/dL (ref 6–23)
CO2: 25 mEq/L (ref 19–32)
Calcium: 9.4 mg/dL (ref 8.4–10.5)
Chloride: 105 mEq/L (ref 96–112)
Creatinine, Ser: 1.17 mg/dL (ref 0.40–1.50)
GFR: 71.1 mL/min (ref >60.00)
Glucose, Bld: 85 mg/dL (ref 70–99)
Potassium: 3.9 mEq/L (ref 3.5–5.1)
Sodium: 141 mEq/L (ref 135–145)
Total Bilirubin: 0.7 mg/dL (ref 0.2–1.2)
Total Protein: 7 g/dL (ref 6.0–8.3)

## 2018-11-26 LAB — CBC WITH DIFFERENTIAL/PLATELET
Basophils Absolute: 0.1 10*3/uL (ref 0.0–0.1)
Basophils Relative: 0.7 % (ref 0.0–3.0)
Eosinophils Absolute: 0.3 10*3/uL (ref 0.0–0.7)
Eosinophils Relative: 3.9 % (ref 0.0–5.0)
HCT: 39.8 % (ref 39.0–52.0)
Hemoglobin: 13.7 g/dL (ref 13.0–17.0)
Lymphocytes Relative: 32.7 % (ref 12.0–46.0)
Lymphs Abs: 2.7 10*3/uL (ref 0.7–4.0)
MCHC: 34.4 g/dL (ref 30.0–36.0)
MCV: 95.6 fl (ref 78.0–100.0)
Monocytes Absolute: 0.8 10*3/uL (ref 0.1–1.0)
Monocytes Relative: 9.4 % (ref 3.0–12.0)
Neutro Abs: 4.4 10*3/uL (ref 1.4–7.7)
Neutrophils Relative %: 53.3 % (ref 43.0–77.0)
Platelets: 200 10*3/uL (ref 150.0–400.0)
RBC: 4.16 Mil/uL — ABNORMAL LOW (ref 4.22–5.81)
RDW: 14.1 % (ref 11.5–15.5)
WBC: 8.2 10*3/uL (ref 4.0–10.5)

## 2018-11-26 LAB — LDL CHOLESTEROL, DIRECT: Direct LDL: 87 mg/dL

## 2018-11-26 LAB — LIPID PANEL
Cholesterol: 182 mg/dL (ref 0–200)
HDL: 40.6 mg/dL (ref >39.00)
NonHDL: 140.93
Total CHOL/HDL Ratio: 4
Triglycerides: 280 mg/dL — ABNORMAL HIGH (ref 0.0–149.0)
VLDL: 56 mg/dL — ABNORMAL HIGH (ref 0.0–40.0)

## 2018-11-26 LAB — TSH: TSH: 122.84 u[IU]/mL — ABNORMAL HIGH (ref 0.35–4.50)

## 2018-11-26 LAB — HEMOGLOBIN A1C: Hgb A1c MFr Bld: 5.6 % (ref 4.6–6.5)

## 2018-11-26 LAB — VITAMIN B12: Vitamin B-12: 221 pg/mL (ref 211–911)

## 2018-11-26 MED ORDER — CYANOCOBALAMIN 1000 MCG/ML IJ SOLN
1000.0000 ug | Freq: Once | INTRAMUSCULAR | Status: AC
  Administered 2018-11-26: 1000 ug via INTRAMUSCULAR

## 2018-11-26 NOTE — Assessment & Plan Note (Signed)
Lab Results  Component Value Date   TSH 122.84 (H) 11/25/2018   Very high  Repeating that with FT4, T3, thyroid antibodies Plan to follow  No doubt-low thyroid function causing more fatigue

## 2018-11-26 NOTE — Assessment & Plan Note (Signed)
Mildly low B12 Lab Results  Component Value Date   VITAMINB12 221 11/25/2018   With fatigue B12 shot today  inst to take 1000 mcg daily po otc

## 2018-11-26 NOTE — Patient Instructions (Addendum)
B12 shot today  Get vitamin B12 over the counter- take 1000 mcg every day   I think you have an underactive thyroid  I am checking more thyroid labs on you to verify this  We will soon likely start you on a thyroid medicine called levothyroxine   Try to start eating better  Exercise when energy improves  Get your cpap started- see pulmonary as planned
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
## 2018-11-26 NOTE — Assessment & Plan Note (Signed)
Multifactorial OSA-to start cpap Low B12- will supplement  Also very high TSH/ hypothyroid-more labs pending and will tx  Expect with more energy pt will be able to exercise and eat better

## 2018-11-26 NOTE — Assessment & Plan Note (Signed)
Diagnosed by sleep study/seen by ENT For pulmonary clinic appt next week to begin process of getting cpap Also working on wt loss

## 2018-11-27 LAB — T4, FREE: Free T4: 0.32 ng/dL — ABNORMAL LOW (ref 0.60–1.60)

## 2018-11-27 LAB — T3, FREE: T3, Free: 2.4 pg/mL (ref 2.3–4.2)

## 2018-11-27 LAB — TSH: TSH: 121.09 u[IU]/mL — ABNORMAL HIGH (ref 0.35–4.50)

## 2018-11-27 LAB — THYROID ANTIBODIES
Thyroglobulin Ab: 4 IU/mL — ABNORMAL HIGH (ref <=1)
Thyroperoxidase Ab SerPl-aCnc: 684 IU/mL — ABNORMAL HIGH (ref <9)

## 2018-11-28 ENCOUNTER — Telehealth: Payer: Self-pay | Admitting: *Deleted

## 2018-11-28 NOTE — Telephone Encounter (Signed)
Received fax from [facility] [address]. Saying:  "pt request new Rx for vit B12 inj"  Please advise

## 2018-11-28 NOTE — Telephone Encounter (Signed)
Spoke to pt. He apologized that he had misunderstood. After he talked to the pharmacy, he read the notes and saw that he needed OTC B12. So he did get some OTC.

## 2018-11-28 NOTE — Telephone Encounter (Signed)
I did not order B12 inj at the pharmacy (he had one shot here) I wanted him to supplement 1000 mcg daily orally over the counter

## 2018-12-01 ENCOUNTER — Telehealth: Payer: Self-pay | Admitting: Family Medicine

## 2018-12-01 ENCOUNTER — Ambulatory Visit (INDEPENDENT_AMBULATORY_CARE_PROVIDER_SITE_OTHER): Payer: BC Managed Care – PPO | Admitting: Internal Medicine

## 2018-12-01 ENCOUNTER — Ambulatory Visit
Admission: RE | Admit: 2018-12-01 | Discharge: 2018-12-01 | Disposition: A | Discharge status: Home / Self Care | Payer: BC Managed Care – PPO | Attending: Internal Medicine | Admitting: Internal Medicine

## 2018-12-01 ENCOUNTER — Other Ambulatory Visit: Payer: Self-pay

## 2018-12-01 ENCOUNTER — Ambulatory Visit
Admission: RE | Admit: 2018-12-01 | Discharge: 2018-12-01 | Disposition: A | Discharge status: Home / Self Care | Payer: BC Managed Care – PPO | Source: Ambulatory Visit | Attending: Internal Medicine | Admitting: Internal Medicine

## 2018-12-01 ENCOUNTER — Encounter: Payer: Self-pay | Admitting: Internal Medicine

## 2018-12-01 VITALS — BP 122/74 | HR 84 | Temp 97.3°F | Ht 72.0 in | Wt 217.0 lb

## 2018-12-01 DIAGNOSIS — R0600 Dyspnea, unspecified: Secondary | ICD-10-CM

## 2018-12-01 DIAGNOSIS — R0609 Other forms of dyspnea: Secondary | ICD-10-CM

## 2018-12-01 DIAGNOSIS — G4733 Obstructive sleep apnea (adult) (pediatric): Secondary | ICD-10-CM | POA: Diagnosis not present

## 2018-12-01 DIAGNOSIS — J4540 Moderate persistent asthma, uncomplicated: Secondary | ICD-10-CM | POA: Diagnosis not present

## 2018-12-01 DIAGNOSIS — R05 Cough: Secondary | ICD-10-CM | POA: Diagnosis not present

## 2018-12-01 DIAGNOSIS — R079 Chest pain, unspecified: Secondary | ICD-10-CM | POA: Diagnosis not present

## 2018-12-01 MED ORDER — ALBUTEROL SULFATE HFA 108 (90 BASE) MCG/ACT IN AERS: 1.0000 | INHALATION_SPRAY | Freq: Four times a day (QID) | RESPIRATORY_TRACT | 3 refills | Status: DC | PRN

## 2018-12-01 MED ORDER — MONTELUKAST SODIUM 10 MG PO TABS: 10.0000 mg | ORAL_TABLET | Freq: Every day | ORAL | 3 refills | Status: DC

## 2018-12-01 MED ORDER — LEVOTHYROXINE SODIUM 50 MCG PO TABS: 50.0000 ug | ORAL_TABLET | Freq: Every day | ORAL | 3 refills | Status: DC

## 2018-12-01 NOTE — Telephone Encounter (Signed)
done

## 2018-12-01 NOTE — Patient Instructions (Addendum)
Will check chest xray.  Will start albuterol inhaler as needed. Will start singulair (montelukast) tablet every night for allergies.  Will start on Auto-CPAP with pressure range of 5-20 cm H2O.      Sleep Apnea    Sleep apnea is disorder that affects a person's sleep. A person with sleep apnea has abnormal pauses in their breathing when they sleep. It is hard for them to get a good sleep. This makes a person tired during the day. It also can lead to other physical problems. There are three types of sleep apnea. One type is when breathing stops for a short time because your airway is blocked (obstructive sleep apnea). Another type is when the brain sometimes fails to give the normal signal to breathe to the muscles that control your breathing (central sleep apnea). The third type is a combination of the other two types.  HOME CARE   Take all medicine as told by your doctor.  Avoid alcohol, calming medicines (sedatives), and depressant drugs.  Try to lose weight if you are overweight. Talk to your doctor about a healthy weight goal.  Your doctor may have you use a device that helps to open your airway. It can help you get the air that you need. It is called a positive airway pressure (PAP) device.   MAKE SURE YOU:   Understand these instructions.  Will watch your condition.  Will get help right away if you are not doing well or get worse.  It may take approximately 1 month for you to get used to wearing her CPAP every night.  Be sure to work with your machine to get used to it, be patient, it may take time!  If you have trouble tolerating CPAP DO NOT RETURN YOUR MACHINE; Contact our office to see if we can help you tolerate the CPAP better first!  Sleep apnea can cause fatal driving accidents, do not drive without using your cpap the night before, do not drive while sleeping.

## 2018-12-01 NOTE — Telephone Encounter (Signed)
Left VM letting Dr. Council Mechanic know Rx sent and to have pt call us back to schedule lab appt in one month

## 2018-12-01 NOTE — Telephone Encounter (Signed)
Patient's Father Herbie Baltimore called today and stated that the patient was suppose to have synthroid 50mg  called into his pharmacy and they have not received it.    Patient's dad would like a call back once this has been sent.    [facility] [address] [address] [facility] # [phone]
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
## 2018-12-10 DIAGNOSIS — G4733 Obstructive sleep apnea (adult) (pediatric): Secondary | ICD-10-CM | POA: Diagnosis not present

## 2018-12-28 ENCOUNTER — Telehealth: Payer: Self-pay | Admitting: Family Medicine

## 2018-12-28 DIAGNOSIS — E039 Hypothyroidism, unspecified: Secondary | ICD-10-CM

## 2018-12-28 DIAGNOSIS — E538 Deficiency of other specified B group vitamins: Secondary | ICD-10-CM

## 2018-12-28 NOTE — Telephone Encounter (Signed)
-----   Message from Ellamae Sia sent at 12/23/2018  9:51 AM EDT ----- Regarding: Lab orders for Monday, 9.14.20 1 month labs

## 2018-12-29 ENCOUNTER — Other Ambulatory Visit (INDEPENDENT_AMBULATORY_CARE_PROVIDER_SITE_OTHER): Payer: BC Managed Care – PPO

## 2018-12-29 DIAGNOSIS — E538 Deficiency of other specified B group vitamins: Secondary | ICD-10-CM

## 2018-12-29 DIAGNOSIS — E039 Hypothyroidism, unspecified: Secondary | ICD-10-CM

## 2018-12-29 LAB — TSH: TSH: 33.59 u[IU]/mL — ABNORMAL HIGH (ref 0.35–4.50)

## 2018-12-29 LAB — VITAMIN B12: Vitamin B-12: 573 pg/mL (ref 211–911)

## 2018-12-30 ENCOUNTER — Telehealth: Payer: Self-pay | Admitting: *Deleted

## 2018-12-30 MED ORDER — LEVOTHYROXINE SODIUM 75 MCG PO TABS: 75.0000 ug | ORAL_TABLET | Freq: Every day | ORAL | 5 refills | Status: DC

## 2018-12-30 NOTE — Telephone Encounter (Signed)
-----   Message from Abner Greenspan, MD sent at 12/29/2018  9:06 PM EDT ----- Released in mychart Please send in levothyroxine 75 mcg 1 po QD #30 5 ref  (replace the 50 mcg with this) Re check TSH in 4-6 weeks

## 2018-12-30 NOTE — Telephone Encounter (Signed)
Pt notified of lab results and Dr. Tower's comments Rx sent to pharmacy and lab appt scheduled  

## 2019-01-02 ENCOUNTER — Ambulatory Visit: Payer: BC Managed Care – PPO | Admitting: Family Medicine

## 2019-01-02 ENCOUNTER — Encounter: Payer: Self-pay | Admitting: Family Medicine

## 2019-01-02 ENCOUNTER — Other Ambulatory Visit: Payer: Self-pay

## 2019-01-02 VITALS — BP 132/80 | HR 78 | Temp 98.1°F | Ht 71.5 in | Wt 218.5 lb

## 2019-01-02 DIAGNOSIS — E538 Deficiency of other specified B group vitamins: Secondary | ICD-10-CM

## 2019-01-02 DIAGNOSIS — L2082 Flexural eczema: Secondary | ICD-10-CM

## 2019-01-02 DIAGNOSIS — E039 Hypothyroidism, unspecified: Secondary | ICD-10-CM | POA: Diagnosis not present

## 2019-01-02 DIAGNOSIS — J301 Allergic rhinitis due to pollen: Secondary | ICD-10-CM

## 2019-01-02 DIAGNOSIS — J309 Allergic rhinitis, unspecified: Secondary | ICD-10-CM | POA: Insufficient documentation

## 2019-01-02 DIAGNOSIS — R0981 Nasal congestion: Secondary | ICD-10-CM | POA: Insufficient documentation

## 2019-01-02 DIAGNOSIS — G4733 Obstructive sleep apnea (adult) (pediatric): Secondary | ICD-10-CM

## 2019-01-02 MED ORDER — TRIAMCINOLONE ACETONIDE 55 MCG/ACT NA AERO: 2.0000 | INHALATION_SPRAY | Freq: Every day | NASAL | 11 refills | Status: DC

## 2019-01-02 MED ORDER — TRIAMCINOLONE ACETONIDE 0.1 % EX CREA: 1.0000 "application " | TOPICAL_CREAM | Freq: Two times a day (BID) | CUTANEOUS | 1 refills | Status: DC | PRN
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
## 2019-01-02 NOTE — Patient Instructions (Addendum)
Use the nasal spray 2 sprays in each nostril twice daily for 3 days and then once daily from there   Use the triamcinolone cream as directed on ear (affected area)   If no improvement in the eczema or nasal congestion in the next 2 weeks let me know   Work on weight loss  Exercise when you can / and eat better

## 2019-01-04 NOTE — Assessment & Plan Note (Signed)
With nasal congestion  Disc pollen avoidance/use of mask while mowing singulair -continue  otc antihistamine prn Add generic nasacort ns bid for 3 d then daily  Update if not starting to improve in a week or if worsening

## 2019-01-04 NOTE — Assessment & Plan Note (Signed)
Improved symptoms with levothyroxine-recent inc to 75 mcg  Lab Results  Component Value Date   TSH 33.59 (H) 12/29/2018   Re check in 5-6 weeks

## 2019-01-04 NOTE — Assessment & Plan Note (Signed)
Improved symptoms so far with cpap  Disc imp of wt loss as well and strategy for that

## 2019-01-04 NOTE — Assessment & Plan Note (Signed)
Trial of generic nasacort -bid for 3 d then daily  Suspect environmental allergies  Disc allergen avoidance when able

## 2019-01-04 NOTE — Assessment & Plan Note (Signed)
Worse in L ear canal Triamcinolone cream 0.1% to affected area bid prn  Avoid harsh chemicals/detergents and hot water

## 2019-01-04 NOTE — Assessment & Plan Note (Signed)
Lab Results  Component Value Date   [id number] 12/29/2018   Improved after one shot and then 1000 mcg oral daily

## 2019-01-07 DIAGNOSIS — M24812 Other specific joint derangements of left shoulder, not elsewhere classified: Secondary | ICD-10-CM | POA: Diagnosis not present

## 2019-01-10 DIAGNOSIS — G4733 Obstructive sleep apnea (adult) (pediatric): Secondary | ICD-10-CM | POA: Diagnosis not present

## 2019-01-29 ENCOUNTER — Ambulatory Visit: Payer: BC Managed Care – PPO | Admitting: Pulmonary Disease

## 2019-02-03 ENCOUNTER — Telehealth: Payer: Self-pay | Admitting: Family Medicine

## 2019-02-03 DIAGNOSIS — E039 Hypothyroidism, unspecified: Secondary | ICD-10-CM

## 2019-02-03 NOTE — Telephone Encounter (Signed)
-----   Message from Ellamae Sia sent at 01/27/2019 11:30 AM EDT ----- Regarding: lab orders for Wednesday,  10.21.20 Lab orders

## 2019-02-04 ENCOUNTER — Other Ambulatory Visit: Payer: BC Managed Care – PPO

## 2019-02-05 ENCOUNTER — Other Ambulatory Visit: Payer: Self-pay

## 2019-02-05 ENCOUNTER — Ambulatory Visit: Payer: BC Managed Care – PPO | Admitting: Pulmonary Disease

## 2019-02-05 ENCOUNTER — Encounter: Payer: Self-pay | Admitting: Pulmonary Disease

## 2019-02-05 VITALS — BP 124/78 | HR 103 | Ht 72.0 in | Wt 220.6 lb

## 2019-02-05 DIAGNOSIS — R0602 Shortness of breath: Secondary | ICD-10-CM | POA: Diagnosis not present

## 2019-02-05 DIAGNOSIS — G4733 Obstructive sleep apnea (adult) (pediatric): Secondary | ICD-10-CM | POA: Diagnosis not present

## 2019-02-05 DIAGNOSIS — Z9989 Dependence on other enabling machines and devices: Secondary | ICD-10-CM
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
## 2019-02-05 NOTE — Patient Instructions (Signed)
Obstructive sleep apnea -Well treated with CPAP at present  Nasal stuffiness/congestion -Continue Singulair -Continue Nasacort -Start Xyzal  I will see you back in the office in about 6 Call with significant symptoms

## 2019-02-09 ENCOUNTER — Telehealth: Payer: Self-pay | Admitting: Family Medicine

## 2019-02-09 DIAGNOSIS — G4733 Obstructive sleep apnea (adult) (pediatric): Secondary | ICD-10-CM | POA: Diagnosis not present

## 2019-02-09 DIAGNOSIS — M25569 Pain in unspecified knee: Secondary | ICD-10-CM

## 2019-02-09 NOTE — Telephone Encounter (Signed)
Referral done I do not have a preference of either  Thanks

## 2019-02-09 NOTE — Telephone Encounter (Signed)
Patients mother called and said Noah [name] has had a growth on his kneecap for about 3 months now. He is working on his house and cant kneel on that knee because it is bothersome. Can you place an Orthopedic Referral or do you need to see the patient for this? The patients father said it would be an Orthopedic knee Specialist that he would need to see. Please advise who you like also. I told them about Dr Mack Guise in [place] or Dr Alvan Dame at [facility] in [place]. Do you have a preference?

## 2019-02-20 DIAGNOSIS — M67461 Ganglion, right knee: Secondary | ICD-10-CM | POA: Diagnosis not present

## 2019-03-10 ENCOUNTER — Ambulatory Visit: Payer: BC Managed Care – PPO | Admitting: Internal Medicine

## 2019-03-10 ENCOUNTER — Encounter: Payer: Self-pay | Admitting: Internal Medicine

## 2019-03-10 ENCOUNTER — Ambulatory Visit (INDEPENDENT_AMBULATORY_CARE_PROVIDER_SITE_OTHER): Payer: BC Managed Care – PPO | Admitting: Internal Medicine

## 2019-03-10 VITALS — Temp 98.8°F | Ht 71.5 in

## 2019-03-10 DIAGNOSIS — J029 Acute pharyngitis, unspecified: Secondary | ICD-10-CM | POA: Diagnosis not present

## 2019-03-10 DIAGNOSIS — R519 Headache, unspecified: Secondary | ICD-10-CM | POA: Diagnosis not present

## 2019-03-10 DIAGNOSIS — Z20822 Contact with and (suspected) exposure to covid-19: Secondary | ICD-10-CM

## 2019-03-10 DIAGNOSIS — R0602 Shortness of breath: Secondary | ICD-10-CM

## 2019-03-10 DIAGNOSIS — R6883 Chills (without fever): Secondary | ICD-10-CM

## 2019-03-10 DIAGNOSIS — R059 Cough, unspecified: Secondary | ICD-10-CM

## 2019-03-10 DIAGNOSIS — R05 Cough: Secondary | ICD-10-CM

## 2019-03-10 DIAGNOSIS — R0981 Nasal congestion: Secondary | ICD-10-CM | POA: Diagnosis not present

## 2019-03-10 DIAGNOSIS — Z20828 Contact with and (suspected) exposure to other viral communicable diseases: Secondary | ICD-10-CM

## 2019-03-10 NOTE — Progress Notes (Signed)
.Virtual Visit via Video Note  I connected with Noah [name] on 03/10/19 at 10:30 AM EST by a video enabled telemedicine application and verified that I am speaking with the correct person using two identifiers.  Location: Patient: Home Provider: Office   I discussed the limitations of evaluation and management by telemedicine and the availability of in person appointments. The patient expressed understanding and agreed to proceed.  History of Present Illness:  Pt reports headaches, nasal congestion, sore throat, cough and shortness of breath and chills. This started 2 days ago. The headache is located behind his right eye. He denies dizziness, visual changes, sensitivity to light or sound, nausea or vomiting. He is blowing clear mucous out of his nose. He denies difficulty swallowing. The cough is productive of clear mucous. He has some shortness of breath with exertion. He denies runny nose, ear pain or loss of taste or smell. He denies fever or body aches but has had some chills. He takes Nasocort, Singulair and Albuterol as needed. He has not taken anything OTC for this. He has had sick contacts diagnosed with COVID +. He does not smoke.   Past Medical History:  Diagnosis Date  . Allergy   . Condyloma 2009  . Depression     Current Outpatient Medications  Medication Sig Dispense Refill  . albuterol (PROAIR HFA) 108 (90 Base) MCG/ACT inhaler Inhale 1-2 puffs into the lungs every 6 (six) hours as needed for wheezing or shortness of breath. 1 g 3  . levothyroxine (SYNTHROID) 75 MCG tablet Take 1 tablet (75 mcg total) by mouth daily. 30 tablet 5  . montelukast (SINGULAIR) 10 MG tablet Take 1 tablet (10 mg total) by mouth at bedtime. 30 tablet 3  . triamcinolone (NASACORT) 55 MCG/ACT AERO nasal inhaler Place 2 sprays into the nose daily. 1 Inhaler 11  . triamcinolone cream (KENALOG) 0.1 % Apply 1 application topically 2 (two) times daily as needed. To affected area of ears 15 g 1    No current facility-administered medications for this visit.     Allergies  Allergen Reactions  . Haloperidol Lactate     Family History  Problem Relation Age of Onset  . Hyperlipidemia Father   . Benign prostatic hyperplasia Father     Social History   Socioeconomic History  . Marital status: Single    Spouse name: Not on file  . Number of children: Not on file  . Years of education: Not on file  . Highest education level: Not on file  Occupational History  . Not on file  Social Needs  . Financial resource strain: Not on file  . Food insecurity    Worry: Not on file    Inability: Not on file  . Transportation needs    Medical: Not on file    Non-medical: Not on file  Tobacco Use  . Smoking status: Former Smoker    Packs/day: 1.00    Types: Cigarettes    Quit date: 05/20/2018    Years since quitting: 0.8  . Smokeless tobacco: Never Used  Substance and Sexual Activity  . Alcohol use: Yes    Alcohol/week: 0.0 standard drinks    Comment: occ  . Drug use: No    Comment: Some marijuana use in the past  . Sexual activity: Not on file  Lifestyle  . Physical activity    Days per week: Not on file    Minutes per session: Not on file  . Stress: Not on file  Relationships  . Social Herbalist on phone: Not on file    Gets together: Not on file    Attends religious service: Not on file    Active member of club or organization: Not on file    Attends meetings of clubs or organizations: Not on file    Relationship status: Not on file  . Intimate partner violence    Fear of current or ex partner: Not on file    Emotionally abused: Not on file    Physically abused: Not on file    Forced sexual activity: Not on file  Other Topics Concern  . Not on file  Social History Narrative   Customer service manager school   Several part time jobs   Lives with parents     Constitutional: Pt reports headache and chills. Denies fever, malaise, fatigue, or abrupt weight  changes.  HEENT: Pt reports nasal congestion, sore throat. Denies eye pain, eye redness, ear pain, ringing in the ears, wax buildup, runny nose, bloody nose. Respiratory: Pt reports cough and shortness of breath. Denies difficulty breathing.   Cardiovascular: Denies chest pain, chest tightness, palpitations or swelling in the hands or feet.  Gastrointestinal: Denies abdominal pain, bloating, constipation, diarrhea or blood in the stool.   No other specific complaints in a complete review of systems (except as listed in HPI above).  Observations/Objective:  Temp 98.8 F (37.1 C) (Oral)   Ht 5' 11.5" (1.816 m)   BMI 30.34 kg/m   Wt Readings from Last 3 Encounters:  02/05/19 220 lb 9.6 oz (100.1 kg)  01/02/19 218 lb 8 oz (99.1 kg)  12/01/18 217 lb (98.4 kg)    General: Appears his stated age, obese, in NAD. Skin: Warm, dry and intact. No rashes noted. HEENT: Head: normal shape and size; Eyes: sclera white, no icterus, conjunctiva pink, PERRLA and EOMs intact; Nose: does not sound congested; Throat/Mouth: no hoarseness noted. Pulmonary/Chest: Normal effort. No respiratory distress.  Neurological: Alert and oriented.    BMET    Component Value Date/Time   NA 141 11/25/2018 1548   NA 140 04/27/2013 1422   K 3.9 11/25/2018 1548   K 3.7 04/27/2013 1422   CL 105 11/25/2018 1548   CL 106 04/27/2013 1422   CO2 25 11/25/2018 1548   CO2 30 04/27/2013 1422   GLUCOSE 85 11/25/2018 1548   GLUCOSE 98 04/27/2013 1422   BUN 19 11/25/2018 1548   BUN 14 04/27/2013 1422   CREATININE 1.17 11/25/2018 1548   CREATININE 0.86 04/27/2013 1422   CALCIUM 9.4 11/25/2018 1548   CALCIUM 8.6 04/27/2013 1422   GFRNONAA >60 04/27/2013 1422   GFRAA >60 04/27/2013 1422    Lipid Panel     Component Value Date/Time   CHOL 182 11/25/2018 1548   TRIG 280.0 (H) 11/25/2018 1548   HDL 40.60 11/25/2018 1548   CHOLHDL 4 11/25/2018 1548   VLDL 56.0 (H) 11/25/2018 1548    CBC    Component Value  Date/Time   WBC 8.2 11/25/2018 1548   RBC 4.16 (L) 11/25/2018 1548   HGB 13.7 11/25/2018 1548   HGB 13.6 04/27/2013 1422   HCT 39.8 11/25/2018 1548   HCT 39.6 (L) 04/27/2013 1422   PLT 200.0 11/25/2018 1548   PLT 170 04/27/2013 1422   MCV 95.6 11/25/2018 1548   MCV 91 04/27/2013 1422   MCH 31.2 04/27/2013 1422   MCHC 34.4 11/25/2018 1548   RDW 14.1 11/25/2018 1548   RDW 13.4  04/27/2013 1422   LYMPHSABS 2.7 11/25/2018 1548   MONOABS 0.8 11/25/2018 1548   EOSABS 0.3 11/25/2018 1548   BASOSABS 0.1 11/25/2018 1548    Hgb A1C Lab Results  Component Value Date   HGBA1C 5.6 11/25/2018       Assessment and Plan:  Acute Headache, Nasal Congestion, Sore Throat, Cough, SOB, Chills and Exposure to COVID 19:  DDX include allergies, viral URI with cough vs COVID 19 Given that his symptoms are mild, will not get COVID tested at this time Encouraged rest and fluids Discussed symptomatic care: Tylenol, Singulair, Nasocort and Albuterol Discussed self quarantine fro 14 days or 7 days from symptom onset (after 3 asymptomatic days without meds) Work note provided Encouraged self quarantine, masking, and social distancing even at home ER precautions discussed   Follow Up Instructions:    I discussed the assessment and treatment plan with the patient. The patient was provided an opportunity to ask questions and all were answered. The patient agreed with the plan and demonstrated an understanding of the instructions.   The patient was advised to call back or seek an in-person evaluation if the symptoms worsen or if the condition fails to improve as anticipated.    Webb Silversmith, NP

## 2019-03-10 NOTE — Patient Instructions (Signed)
COVID-19 Frequently Asked Questions °COVID-19 (coronavirus disease) is an infection that is caused by a large family of viruses. Some viruses cause illness in people and others cause illness in animals like camels, cats, and bats. In some cases, the viruses that cause illness in animals can spread to humans. °Where did the coronavirus come from? °In December 2019, [place] told the World Health Organization (WHO) of several cases of lung disease (human respiratory illness). These cases were linked to an open seafood and livestock market in the city of [place]. The link to the seafood and livestock market suggests that the virus may have spread from animals to humans. However, since that first outbreak in December, the virus has also been shown to spread from person to person. °What is the name of the disease and the virus? °Disease name °Early on, this disease was called novel coronavirus. This is because scientists determined that the disease was caused by a new (novel) respiratory virus. The World Health Organization (WHO) has now named the disease COVID-19, or coronavirus disease. °Virus name °The virus that causes the disease is called severe acute respiratory syndrome coronavirus 2 (SARS-CoV-2). °More information on disease and virus naming °World Health Organization (WHO): www.who.int/emergencies/diseases/novel-coronavirus-2019/technical-guidance/naming-the-coronavirus-disease-(covid-2019)-and-the-virus-that-causes-it °Who is at risk for complications from coronavirus disease? °Some people may be at higher risk for complications from coronavirus disease. This includes older adults and people who have chronic diseases, such as heart disease, diabetes, and lung disease. °If you are at higher risk for complications, take these extra precautions: °· Avoid close contact with people who are sick or have a fever or cough. Stay at least 3-6 ft (1-2 m) away from them, if possible. °· Wash your hands often with soap and  water for at least 20 seconds. °· Avoid touching your face, mouth, nose, or eyes. °· Keep supplies on hand at home, such as food, medicine, and cleaning supplies. °· Stay home as much as possible. °· Avoid social gatherings and travel. °How does coronavirus disease spread? °The virus that causes coronavirus disease spreads easily from person to person (is contagious). There are also cases of community-spread disease. This means the disease has spread to: °· People who have no known contact with other infected people. °· People who have not traveled to areas where there are known cases. °It appears to spread from one person to another through droplets from coughing or sneezing. °Can I get the virus from touching surfaces or objects? °There is still a lot that we do not know about the virus that causes coronavirus disease. Scientists are basing a lot of information on what they know about similar viruses, such as: °· Viruses cannot generally survive on surfaces for long. They need a human body (host) to survive. °· It is more likely that the virus is spread by close contact with people who are sick (direct contact), such as through: °? Shaking hands or hugging. °? Breathing in respiratory droplets that travel through the air. This can happen when an infected person coughs or sneezes on or near other people. °· It is less likely that the virus is spread when a person touches a surface or object that has the virus on it (indirect contact). The virus may be able to enter the body if the person touches a surface or object and then touches his or her face, eyes, nose, or mouth. °Can a person spread the virus without having symptoms of the disease? °It may be possible for the virus to spread before a person   has symptoms of the disease, but this is most likely not the main way the virus is spreading. It is more likely for the virus to spread by being in close contact with people who are sick and breathing in the respiratory  droplets of a sick person's cough or sneeze. °What are the symptoms of coronavirus disease? °Symptoms vary from person to person and can range from mild to severe. Symptoms may include: °· Fever. °· Cough. °· Tiredness, weakness, or fatigue. °· Fast breathing or feeling short of breath. °These symptoms can appear anywhere from 2 to 14 days after you have been exposed to the virus. If you develop symptoms, call your health care provider. People with severe symptoms may need hospital care. °If I am exposed to the virus, how long does it take before symptoms start? °Symptoms of coronavirus disease may appear anywhere from 2 to 14 days after a person has been exposed to the virus. If you develop symptoms, call your health care provider. °Should I be tested for this virus? °Your health care provider will decide whether to test you based on your symptoms, history of exposure, and your risk factors. °How does a health care provider test for this virus? °Health care providers will collect samples to send for testing. Samples may include: °· Taking a swab of fluid from the nose. °· Taking fluid from the lungs by having you cough up mucus (sputum) into a sterile cup. °· Taking a blood sample. °· Taking a stool or urine sample. °Is there a treatment or vaccine for this virus? °Currently, there is no vaccine to prevent coronavirus disease. Also, there are no medicines like antibiotics or antivirals to treat the virus. A person who becomes sick is given supportive care, which means rest and fluids. A person may also relieve his or her symptoms by using over-the-counter medicines that treat sneezing, coughing, and runny nose. These are the same medicines that a person takes for the common cold. °If you develop symptoms, call your health care provider. People with severe symptoms may need hospital care. °What can I do to protect myself and my family from this virus? ° °  ° °You can protect yourself and your family by taking the  same actions that you would take to prevent the spread of other viruses. Take the following actions: °· Wash your hands often with soap and water for at least 20 seconds. If soap and water are not available, use alcohol-based hand sanitizer. °· Avoid touching your face, mouth, nose, or eyes. °· Cough or sneeze into a tissue, sleeve, or elbow. Do not cough or sneeze into your hand or the air. °? If you cough or sneeze into a tissue, throw it away immediately and wash your hands. °· Disinfect objects and surfaces that you frequently touch every day. °· Avoid close contact with people who are sick or have a fever or cough. Stay at least 3-6 ft (1-2 m) away from them, if possible. °· Stay home if you are sick, except to get medical care. Call your health care provider before you get medical care. °· Make sure your vaccines are up to date. Ask your health care provider what vaccines you need. °What should I do if I need to travel? °Follow travel recommendations from your local health authority, the CDC, and WHO. °Travel information and advice °· Centers for Disease Control and Prevention (CDC): www.cdc.gov/coronavirus/2019-ncov/travelers/index.html °· World Health Organization (WHO): www.who.int/emergencies/diseases/novel-coronavirus-2019/travel-advice °Know the risks and take action to protect your health °·   You are at higher risk of getting coronavirus disease if you are traveling to areas with an outbreak or if you are exposed to travelers from areas with an outbreak. °· Wash your hands often and practice good hygiene to lower the risk of catching or spreading the virus. °What should I do if I am sick? °General instructions to stop the spread of infection °· Wash your hands often with soap and water for at least 20 seconds. If soap and water are not available, use alcohol-based hand sanitizer. °· Cough or sneeze into a tissue, sleeve, or elbow. Do not cough or sneeze into your hand or the air. °· If you cough or  sneeze into a tissue, throw it away immediately and wash your hands. °· Stay home unless you must get medical care. Call your health care provider or local health authority before you get medical care. °· Avoid public areas. Do not take public transportation, if possible. °· If you can, wear a mask if you must go out of the house or if you are in close contact with someone who is not sick. °Keep your home clean °· Disinfect objects and surfaces that are frequently touched every day. This may include: °? Counters and tables. °? Doorknobs and light switches. °? Sinks and faucets. °? Electronics such as phones, remote controls, keyboards, computers, and tablets. °· Wash dishes in hot, soapy water or use a dishwasher. Air-dry your dishes. °· Wash laundry in hot water. °Prevent infecting other household members °· Let healthy household members care for children and pets, if possible. If you have to care for children or pets, wash your hands often and wear a mask. °· Sleep in a different bedroom or bed, if possible. °· Do not share personal items, such as razors, toothbrushes, deodorant, combs, brushes, towels, and washcloths. °Where to find more information °Centers for Disease Control and Prevention (CDC) °· Information and news updates: www.cdc.gov/coronavirus/2019-ncov °World Health Organization (WHO) °· Information and news updates: www.who.int/emergencies/diseases/novel-coronavirus-2019 °· Coronavirus health topic: www.who.int/health-topics/coronavirus °· Questions and answers on COVID-19: www.who.int/news-room/q-a-detail/q-a-coronaviruses °· Global tracker: who.sprinklr.com °American Academy of Pediatrics (AAP) °· Information for families: www.healthychildren.org/English/health-issues/conditions/chest-lungs/Pages/2019-Novel-Coronavirus.aspx °The coronavirus situation is changing rapidly. Check your local health authority website or the CDC and WHO websites for updates and news. °When should I contact a health care  provider? °· Contact your health care provider if you have symptoms of an infection, such as fever or cough, and you: °? Have been near anyone who is known to have coronavirus disease. °? Have come into contact with a person who is suspected to have coronavirus disease. °? Have traveled outside of the country. °When should I get emergency medical care? °· Get help right away by calling your local emergency services (911 in the [place] if you have: °? Trouble breathing. °? Pain or pressure in your chest. °? Confusion. °? Blue-tinged lips and fingernails. °? Difficulty waking from sleep. °? Symptoms that get worse. °Let the emergency medical personnel know if you think you have coronavirus disease. °Summary °· A new respiratory virus is spreading from person to person and causing COVID-19 (coronavirus disease). °· The virus that causes COVID-19 appears to spread easily. It spreads from one person to another through droplets from coughing or sneezing. °· Older adults and those with chronic diseases are at higher risk of disease. If you are at higher risk for complications, take extra precautions. °· There is currently no vaccine to prevent coronavirus disease. There are no medicines, such as antibiotics or   antivirals, to treat the virus. °· You can protect yourself and your family by washing your hands often, avoiding touching your face, and covering your coughs and sneezes. °This information is not intended to replace advice given to you by your health care provider. Make sure you discuss any questions you have with your health care provider. °Document Released: 07/29/2018 Document Revised: 07/29/2018 Document Reviewed: 07/29/2018 °Elsevier Patient Education © 2020 [employer] ° °

## 2019-03-12 DIAGNOSIS — G4733 Obstructive sleep apnea (adult) (pediatric): Secondary | ICD-10-CM | POA: Diagnosis not present

## 2019-03-27 ENCOUNTER — Telehealth: Payer: Self-pay | Admitting: Family Medicine

## 2019-03-27 DIAGNOSIS — N4889 Other specified disorders of penis: Secondary | ICD-10-CM | POA: Insufficient documentation

## 2019-03-27 NOTE — Telephone Encounter (Signed)
Ref done  Will route to [facility] 

## 2019-03-27 NOTE — Telephone Encounter (Signed)
Received a call from patients father for the patient. Patient is having Penis pain mid shaft and it started 3 days ago and not going away. Patients father has tried to reassure [name] that it could just be muscular but patient would like to go see a Dealer for it. Please call patient on the cell # when appointment is made.

## 2019-03-27 NOTE — Telephone Encounter (Signed)
Appt scheduled and patient is aware.

## 2019-04-06 DIAGNOSIS — N4881 Thrombosis of superficial vein of penis: Secondary | ICD-10-CM | POA: Diagnosis not present

## 2019-04-24 ENCOUNTER — Other Ambulatory Visit: Payer: Self-pay | Admitting: Internal Medicine

## 2019-04-27 ENCOUNTER — Encounter: Payer: Self-pay | Admitting: Family Medicine

## 2019-04-27 ENCOUNTER — Other Ambulatory Visit: Payer: Self-pay

## 2019-04-27 ENCOUNTER — Ambulatory Visit: Payer: BC Managed Care – PPO | Admitting: Family Medicine

## 2019-04-27 VITALS — BP 130/88 | HR 75 | Temp 97.7°F | Ht 71.5 in | Wt 212.3 lb

## 2019-04-27 DIAGNOSIS — Z113 Encounter for screening for infections with a predominantly sexual mode of transmission: Secondary | ICD-10-CM | POA: Insufficient documentation

## 2019-04-27 DIAGNOSIS — F17200 Nicotine dependence, unspecified, uncomplicated: Secondary | ICD-10-CM | POA: Diagnosis not present

## 2019-04-27 DIAGNOSIS — G4733 Obstructive sleep apnea (adult) (pediatric): Secondary | ICD-10-CM

## 2019-04-27 DIAGNOSIS — E039 Hypothyroidism, unspecified: Secondary | ICD-10-CM

## 2019-04-27 DIAGNOSIS — Z209 Contact with and (suspected) exposure to unspecified communicable disease: Secondary | ICD-10-CM | POA: Diagnosis not present

## 2019-04-27 NOTE — Assessment & Plan Note (Signed)
Pt is interested in STD screening  One partner/ does not use condoms  No known exposures and no symptoms Gc/chlamydia and HIV , Hep C and RPR drawn today  Discussed safe sexual practices

## 2019-04-27 NOTE — Assessment & Plan Note (Signed)
Feels better using cpap

## 2019-04-27 NOTE — Assessment & Plan Note (Signed)
Pt unfortunately started smoking again  Urged/counseled to quit when ready (he is not ready currently)

## 2019-04-27 NOTE — Patient Instructions (Addendum)
If you miss a dose of thyroid medication-take it when you remember   Labs today for thyroid and STD screening  Think about having your partner tested also    Take care of yourself   Keep using your cpap

## 2019-04-27 NOTE — Assessment & Plan Note (Addendum)
TSH drawn today  Aware pt has had 3 missed doses in the past 14 days inst to still take levothyroxine on missed days as soon as he remembers/also to set a reminder on his phone
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
## 2019-04-28 LAB — C. TRACHOMATIS/N. GONORRHOEAE RNA
C. trachomatis RNA, TMA: NOT DETECTED
N. gonorrhoeae RNA, TMA: NOT DETECTED

## 2019-04-28 LAB — RPR: RPR Ser Ql: NONREACTIVE

## 2019-04-28 LAB — HEPATITIS C ANTIBODY
Hepatitis C Ab: NONREACTIVE
SIGNAL TO CUT-OFF: 0.03 (ref <1.00)

## 2019-04-28 LAB — TSH: TSH: 19.77 u[IU]/mL — ABNORMAL HIGH (ref 0.35–4.50)

## 2019-04-28 LAB — HIV ANTIBODY (ROUTINE TESTING W REFLEX): HIV 1&2 Ab, 4th Generation: NONREACTIVE

## 2019-04-29 ENCOUNTER — Telehealth: Payer: Self-pay | Admitting: *Deleted

## 2019-04-29 MED ORDER — LEVOTHYROXINE SODIUM 100 MCG PO TABS: 100.0000 ug | ORAL_TABLET | Freq: Every day | ORAL | 3 refills | Status: DC

## 2019-04-29 NOTE — Telephone Encounter (Signed)
-----   Message from Abner Greenspan, MD sent at 04/28/2019  9:06 PM EST ----- Hep C screen negative  TSH is still improving but not at goal  Aware of missed doses -still think we need to increase dose again  Please inc levothyroxine from 75 mcg daily to 100 mcg daily (please send in 30 with 3 refills)  Re check TSH in 4-6 weeks

## 2019-04-29 NOTE — Telephone Encounter (Signed)
Pt notified of labs and Dr. Marliss Coots comments. Rx sent to pharmacy and lab appt scheduled

## 2019-05-05 ENCOUNTER — Encounter: Payer: Self-pay | Admitting: Family Medicine

## 2019-05-05 DIAGNOSIS — Z209 Contact with and (suspected) exposure to unspecified communicable disease: Secondary | ICD-10-CM | POA: Insufficient documentation

## 2019-05-12 DIAGNOSIS — G4733 Obstructive sleep apnea (adult) (pediatric): Secondary | ICD-10-CM | POA: Diagnosis not present

## 2019-06-04 ENCOUNTER — Other Ambulatory Visit: Payer: BC Managed Care – PPO

## 2019-06-12 DIAGNOSIS — G4733 Obstructive sleep apnea (adult) (pediatric): Secondary | ICD-10-CM | POA: Diagnosis not present

## 2019-07-02 ENCOUNTER — Other Ambulatory Visit (INDEPENDENT_AMBULATORY_CARE_PROVIDER_SITE_OTHER): Payer: BC Managed Care – PPO

## 2019-07-02 ENCOUNTER — Other Ambulatory Visit: Payer: Self-pay

## 2019-07-02 DIAGNOSIS — E039 Hypothyroidism, unspecified: Secondary | ICD-10-CM | POA: Diagnosis not present

## 2019-07-03 LAB — TSH: TSH: 1.12 u[IU]/mL (ref 0.35–4.50)

## 2019-07-06 ENCOUNTER — Encounter: Payer: Self-pay | Admitting: Family Medicine

## 2019-07-06 ENCOUNTER — Ambulatory Visit: Payer: BC Managed Care – PPO | Admitting: Family Medicine

## 2019-07-06 ENCOUNTER — Other Ambulatory Visit: Payer: Self-pay

## 2019-07-06 VITALS — BP 118/70 | HR 80 | Temp 98.2°F | Ht 71.5 in | Wt 205.6 lb

## 2019-07-06 DIAGNOSIS — R5382 Chronic fatigue, unspecified: Secondary | ICD-10-CM | POA: Diagnosis not present

## 2019-07-06 DIAGNOSIS — E538 Deficiency of other specified B group vitamins: Secondary | ICD-10-CM | POA: Diagnosis not present

## 2019-07-06 DIAGNOSIS — E039 Hypothyroidism, unspecified: Secondary | ICD-10-CM | POA: Diagnosis not present

## 2019-07-06 DIAGNOSIS — F17200 Nicotine dependence, unspecified, uncomplicated: Secondary | ICD-10-CM

## 2019-07-06 DIAGNOSIS — G4733 Obstructive sleep apnea (adult) (pediatric): Secondary | ICD-10-CM

## 2019-07-06 MED ORDER — LEVOTHYROXINE SODIUM 100 MCG PO TABS: 100.0000 ug | ORAL_TABLET | Freq: Every day | ORAL | 3 refills | Status: DC

## 2019-07-06 MED ORDER — TRIAMCINOLONE ACETONIDE 55 MCG/ACT NA AERO: 2.0000 | INHALATION_SPRAY | Freq: Every day | NASAL | 11 refills | Status: DC

## 2019-07-06 NOTE — Assessment & Plan Note (Signed)
Hypothyroidism  Pt has no clinical changes No change in energy level/ hair or skin/ edema and no tremor Lab Results  Component Value Date   TSH 1.12 07/02/2019     Doing much better

## 2019-07-06 NOTE — Assessment & Plan Note (Signed)
Disc in detail risks of smoking and possible outcomes including copd, vascular/ heart disease, cancer , respiratory and sinus infections  Pt voices understanding He is not yet ready to quit

## 2019-07-06 NOTE — Patient Instructions (Addendum)
I think it is worth trying a full face mask instead of the nasal alone You can call the pulmonary office to see if there would need to be a change in setting  You can stop using humidified air if more comfortable without it   Keep using cpap regularly   Take care of yourself  Keep loosing weight  Keep trying to back off fast food   Keep thinking about quitting smoking

## 2019-07-06 NOTE — Assessment & Plan Note (Addendum)
Overall doing much better  Has an occasional nights when cpap does not work as well (thinks he mouth breathes)  He plans to try a full face mask and check in with pulmonary re: if pressures need to be adjusted   B12 def and hypothyroidism are now treated

## 2019-07-06 NOTE — Assessment & Plan Note (Signed)
Improved after one B12 shot  Now takig 1000 mcg B12 daily orally Lab Results  Component Value Date   VITAMINB12 573 12/29/2018

## 2019-07-06 NOTE — Assessment & Plan Note (Addendum)
Overall much better with cpap  Some nights he suspects he mouth breathes and would like to try a full face mask  He may need to check with pulmonary and make sure settings would remain the same  Also prefers no humification-I think that would be fine  Also enc him to keep loosing weight
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
## 2019-07-10 DIAGNOSIS — G4733 Obstructive sleep apnea (adult) (pediatric): Secondary | ICD-10-CM | POA: Diagnosis not present

## 2019-08-10 DIAGNOSIS — G4733 Obstructive sleep apnea (adult) (pediatric): Secondary | ICD-10-CM | POA: Diagnosis not present

## 2019-09-04 ENCOUNTER — Other Ambulatory Visit: Payer: Self-pay | Admitting: Family Medicine

## 2019-10-27 ENCOUNTER — Telehealth: Payer: Self-pay | Admitting: *Deleted

## 2019-10-27 DIAGNOSIS — E039 Hypothyroidism, unspecified: Secondary | ICD-10-CM

## 2019-10-27 DIAGNOSIS — E538 Deficiency of other specified B group vitamins: Secondary | ICD-10-CM

## 2019-10-27 NOTE — Telephone Encounter (Signed)
Per DPR left VM letting mother know lab orders are in and pt can call and schedule an appt

## 2019-10-27 NOTE — Telephone Encounter (Signed)
Patient's mom called stating that [name] is getting 8 hours of sleep every night but still waking up real tired. Bonnita Nasuti stated that [name] has a machine for his sleep apnea with a small mask. Bonnita Nasuti stated that [name] has an appointment this Friday with a respiratory/sleep doctor.  Bonnita Nasuti stated that [name] is going to see about getting a full mask for his machine. Bonnita Nasuti stated that once before when he was feeling like this his thyroid was out of the normal range. Bonnita Nasuti is aware that [name]'s thyroid level was checked back in March, but she wants to know if it should be checked again since he is so fatigued?  Bonnita Nasuti stated that [name] is scheduled to see Dr Ashby Dawes 10/30/19.

## 2019-10-27 NOTE — Telephone Encounter (Signed)
I ordered a TSH and B12 level  Please schedule a non fasting lab appt  Thanks for letting me know

## 2019-10-28 NOTE — Telephone Encounter (Signed)
Pt called back and scheduled a lab appt

## 2019-11-02 ENCOUNTER — Other Ambulatory Visit (INDEPENDENT_AMBULATORY_CARE_PROVIDER_SITE_OTHER): Payer: BC Managed Care – PPO

## 2019-11-02 ENCOUNTER — Other Ambulatory Visit: Payer: Self-pay

## 2019-11-02 DIAGNOSIS — E538 Deficiency of other specified B group vitamins: Secondary | ICD-10-CM | POA: Diagnosis not present

## 2019-11-02 DIAGNOSIS — E039 Hypothyroidism, unspecified: Secondary | ICD-10-CM

## 2019-11-02 LAB — VITAMIN B12: Vitamin B-12: 561 pg/mL (ref 211–911)

## 2019-11-02 LAB — TSH: TSH: 6.67 u[IU]/mL — ABNORMAL HIGH (ref 0.35–4.50)

## 2019-11-03 ENCOUNTER — Telehealth: Payer: Self-pay | Admitting: Family Medicine

## 2019-11-03 MED ORDER — LEVOTHYROXINE SODIUM 112 MCG PO TABS
112.0000 ug | ORAL_TABLET | Freq: Every day | ORAL | 3 refills | Status: DC
Start: 2019-11-03 — End: 2020-01-28

## 2019-11-03 NOTE — Telephone Encounter (Signed)
I sent in 112 mcg levothyroxine Re check TSH in 1 mo please

## 2019-11-03 NOTE — Telephone Encounter (Signed)
-----   Message from Tammi Sou, [place] sent at 11/03/2019 12:18 PM EDT ----- Pt notified of lab results and Dr. Marliss Coots comments. Pt said he hasn't missed any doses of his thyroid meds in "weeks" (over a month ago), pt is okay with increasing meds. [facility] [address].

## 2019-11-04 NOTE — Telephone Encounter (Signed)
Pt notified Rx sent to pharmacy and lab appt scheduled

## 2019-12-06 ENCOUNTER — Telehealth: Payer: Self-pay | Admitting: Family Medicine

## 2019-12-06 DIAGNOSIS — E039 Hypothyroidism, unspecified: Secondary | ICD-10-CM

## 2019-12-06 NOTE — Telephone Encounter (Signed)
-----   Message from Cloyd Stagers, RT sent at 11/24/2019  2:53 PM EDT ----- Regarding: Lab Orders for Monday 8.23.2021 Please place lab orders for Monday 8.23.2021, appt notes state " recheck TSH" Thank you, Dyke Maes RT(R)

## 2019-12-07 ENCOUNTER — Other Ambulatory Visit: Payer: Self-pay

## 2019-12-07 ENCOUNTER — Other Ambulatory Visit (INDEPENDENT_AMBULATORY_CARE_PROVIDER_SITE_OTHER): Payer: BC Managed Care – PPO

## 2019-12-07 DIAGNOSIS — E039 Hypothyroidism, unspecified: Secondary | ICD-10-CM

## 2019-12-08 LAB — TSH: TSH: 1.44 u[IU]/mL (ref 0.35–4.50)

## 2019-12-09 ENCOUNTER — Encounter: Payer: Self-pay | Admitting: *Deleted

## 2020-01-28 ENCOUNTER — Other Ambulatory Visit: Payer: Self-pay | Admitting: Family Medicine

## 2020-04-11 ENCOUNTER — Ambulatory Visit (INDEPENDENT_AMBULATORY_CARE_PROVIDER_SITE_OTHER)
Admission: RE | Admit: 2020-04-11 | Discharge: 2020-04-11 | Disposition: A | Discharge status: Home / Self Care | Payer: BC Managed Care – PPO | Source: Ambulatory Visit | Attending: Family Medicine | Admitting: Family Medicine

## 2020-04-11 ENCOUNTER — Telehealth: Payer: Self-pay

## 2020-04-11 ENCOUNTER — Other Ambulatory Visit: Payer: Self-pay

## 2020-04-11 ENCOUNTER — Ambulatory Visit: Payer: BC Managed Care – PPO | Admitting: Family Medicine

## 2020-04-11 ENCOUNTER — Encounter: Payer: Self-pay | Admitting: Family Medicine

## 2020-04-11 VITALS — BP 124/66 | HR 60 | Temp 97.2°F | Ht 71.5 in | Wt 219.3 lb

## 2020-04-11 DIAGNOSIS — G4733 Obstructive sleep apnea (adult) (pediatric): Secondary | ICD-10-CM | POA: Diagnosis not present

## 2020-04-11 DIAGNOSIS — R0981 Nasal congestion: Secondary | ICD-10-CM

## 2020-04-11 DIAGNOSIS — R7981 Abnormal blood-gas level: Secondary | ICD-10-CM | POA: Insufficient documentation

## 2020-04-11 DIAGNOSIS — F17200 Nicotine dependence, unspecified, uncomplicated: Secondary | ICD-10-CM

## 2020-04-11 MED ORDER — TRIAMCINOLONE ACETONIDE 55 MCG/ACT NA AERO: 2.0000 | INHALATION_SPRAY | Freq: Every day | NASAL | 11 refills | Status: DC

## 2020-04-11 NOTE — Patient Instructions (Addendum)
Chest xray today  I placed a referral for pulmonary visit- you may need your settings changed   Weight loss may help Anything you can do to get away from junk food -please do  Extra cardio/exercise helps also   Keep thinking about quitting smoking

## 2020-04-11 NOTE — Assessment & Plan Note (Signed)
At night with cpap  Nl while awake  Smoker  No sob or cough cxr now  Ref to pulm for visit (suspect to titrate cpap settings) Enc him to tx nasal congestion with nasal steroid spray

## 2020-04-11 NOTE — Telephone Encounter (Signed)
I will see him today.

## 2020-04-11 NOTE — Telephone Encounter (Signed)
[facility] Day - Client TELEPHONE ADVICE RECORD [facility] Patient Name: Noah [name] Gender: Male DOB: 23-Jul-1985 Age: 34 Y 11 M 24 D Return Phone Number: [phone] (Primary), [phone] (Secondary) Address: City/State/Zip: [place] [address] Client New Site Day - Client Client Site [facility] Physician Glori Bickers, Roque Lias - MD Contact Type Call Who Is Calling Patient / Member / Family / Caregiver Call Type Triage / Clinical Caller Name Wellington Winegarden Relationship To Patient Mother Return Phone Number [phone] (Primary) Chief Complaint Fatigue (>THREE MONTHS) Reason for Call Symptomatic / Request for [place] states her son needs to be evaluated. His oxygen level is at 91. The last 3 nights he's woken up with headaches. He is on a cpap machine. Mom is wondering if the dr can refer him to a specialist. Translation No No Triage Reason Patient declined Nurse Assessment Nurse: Donna Christen, RN, Legrand Como Date/Time Eilene Ghazi Time): 04/07/2020 3:16:49 PM Confirm and document reason for call. If symptomatic, describe symptoms. ---Caller states her son needs to be evaluated. His oxygen level is at 91. The last 3 nights he's woken up with headaches. He is on a c-pap machine. Mom is wondering if the Dr. can refer him to a specialist. Has hypothyroid issues also. Wanted to make appointment to be seen next week and get referral for pulmonologist. Does the patient have any new or worsening symptoms? ---Yes Will a triage be completed? ---No Select reason for no triage. ---Patient declined Disp. Time Eilene Ghazi Time) Disposition Final User 04/07/2020 3:22:39 PM Clinical Call Yes Donna Christen, RN, Legrand Como  Patient has an appointment with Dr. Glori Bickers today at 1400.

## 2020-04-11 NOTE — Assessment & Plan Note (Signed)
Enc continued nasacort ns -refilled  Using face mask for cpap now

## 2020-04-11 NOTE — Assessment & Plan Note (Signed)
De sat at night- under 90, with fatigue  Also waking up with headaches Has gained over 10 lb since last eval May need titration/pressure change Also mask issues/not fitting and nasal congestion (refilled steroid ns)

## 2020-04-11 NOTE — Assessment & Plan Note (Signed)
1ppd now  Some de satting at night with cpap  Denies sob or wheezing  cxr today  Wants to quit cold [place]  Disc in detail risks of smoking and possible outcomes including copd, vascular/ heart disease, cancer , respiratory and sinus infections  Pt voices understanding Unsure if ready but so far no cig today or yesterday Unfortunately pt declines covid or flu vaccine-questions answered today
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
## 2020-04-14 ENCOUNTER — Encounter: Payer: Self-pay | Admitting: Pulmonary Disease

## 2020-04-14 ENCOUNTER — Ambulatory Visit (INDEPENDENT_AMBULATORY_CARE_PROVIDER_SITE_OTHER): Payer: BC Managed Care – PPO | Admitting: Pulmonary Disease

## 2020-04-14 ENCOUNTER — Other Ambulatory Visit: Payer: Self-pay

## 2020-04-14 VITALS — BP 122/84 | HR 71 | Temp 97.4°F | Ht 72.0 in | Wt 221.4 lb

## 2020-04-14 DIAGNOSIS — G4733 Obstructive sleep apnea (adult) (pediatric): Secondary | ICD-10-CM | POA: Diagnosis not present

## 2020-04-14 DIAGNOSIS — R519 Headache, unspecified: Secondary | ICD-10-CM | POA: Insufficient documentation

## 2020-04-14 DIAGNOSIS — Z87891 Personal history of nicotine dependence: Secondary | ICD-10-CM

## 2020-04-14 DIAGNOSIS — Z0000 Encounter for general adult medical examination without abnormal findings: Secondary | ICD-10-CM | POA: Insufficient documentation

## 2020-04-14 DIAGNOSIS — J301 Allergic rhinitis due to pollen: Secondary | ICD-10-CM | POA: Diagnosis not present

## 2020-04-14 DIAGNOSIS — Z9989 Dependence on other enabling machines and devices: Secondary | ICD-10-CM

## 2020-04-14 NOTE — Assessment & Plan Note (Signed)
Plan: Continue CPAP therapy We will order overnight oximetry to assess nocturnal hypoxemia

## 2020-04-14 NOTE — Assessment & Plan Note (Signed)
Plan: We will order overnight oximetry to further investigate morning headaches

## 2020-04-14 NOTE — Progress Notes (Signed)
@Patient  ID: Noah [name], male    DOB: August 02, 1985, 34 y.o.   MRN: [id number]  Chief Complaint  Patient presents with   Follow-up    OSA, waking up with headaches    Referring provider: Tower, Wynelle Fanny, MD  HPI:  34 year old male former smoker followed in our office for obstructive sleep apnea  PMH: Fatigue, history of depression Smoker/ Smoking History: Former smoker.  Stop smoking on 04/09/2020.  27-pack-year smoking history. Maintenance: None Pt of: Dr. Jenetta Downer  04/14/2020  - Visit   34 year old male former smoker followed in our office for obstructive sleep apnea.  Patient recently stopped smoking 5 days ago on 04/09/2020.  27-pack-year smoking history.  Patient reports adherence to CPAP therapy.  CPAP compliance report listed below:  03/13/2020-04/11/2020-CPAP compliance report-30 had a last 30 days use-average usage 8 hours and 35 minutes, APAP setting 5-20, AHI 0.3  Patient reports adherence to CPAP therapy but he is also endorsing 1 month of morning headaches.  He reports that he has been checking his oxygen levels when he is waking up and they have been in the low 80s.  We will discuss this in further evaluate this today.  Patient reports he is doing okay since stopping smoking.  He is not interested in nicotine replacement therapies at this point in time.  Patient remains unvaccinated COVID-19, the seasonal flu as well as pneumonia vaccinations.  We will discuss this today.  He reports that he does plan on potentially receiving the COVID-19 vaccination if he has to receive this for his employment.  He does not plan on receiving the seasonal flu vaccine.  Patient does endorse fairly regular and consistent congestion.  He denies rhinitis symptoms.  He has never had formal allergy testing but he reports that he is significantly allergic to grasses as well as dogs.  This is based off his own observation.  We will discuss this today.  He does take Singulair.  He does not feel  that daily antihistamines help.  Nasacort does help.  Questionaires / Pulmonary Flowsheets:   ACT:  No flowsheet data found.  MMRC: No flowsheet data found.  Epworth:  Results of the Epworth flowsheet 02/05/2019 12/01/2018  Sitting and reading 0 0  Watching TV 3 1  Sitting, inactive in a public place (e.g. a theatre or a meeting) 0 1  As a passenger in a car for an hour without a break 3 2  Lying down to rest in the afternoon when circumstances permit 2 3  Sitting and talking to someone 0 0  Sitting quietly after a lunch without alcohol 3 1  In a car, while stopped for a few minutes in traffic 0 0  Total score 11 8    Tests:   FENO:  No results found for: NITRICOXIDE  PFT: No flowsheet data found.  WALK:  No flowsheet data found.  Imaging: DG Chest 2 View  Result Date: 04/11/2020 CLINICAL DATA:  Low oxygen saturation EXAM: CHEST - 2 VIEW COMPARISON:  12/01/2018 FINDINGS: The heart size and mediastinal contours are within normal limits. Both lungs are clear. The visualized skeletal structures are unremarkable. IMPRESSION: No active cardiopulmonary disease. Electronically Signed   By: Donavan Foil M.D.   On: 04/11/2020 15:31    Lab Results:  CBC    Component Value Date/Time   WBC 8.2 11/25/2018 1548   RBC 4.16 (L) 11/25/2018 1548   HGB 13.7 11/25/2018 1548   HGB 13.6 04/27/2013 1422   HCT  39.8 11/25/2018 1548   HCT 39.6 (L) 04/27/2013 1422   PLT 200.0 11/25/2018 1548   PLT 170 04/27/2013 1422   MCV 95.6 11/25/2018 1548   MCV 91 04/27/2013 1422   MCH 31.2 04/27/2013 1422   MCHC 34.4 11/25/2018 1548   RDW 14.1 11/25/2018 1548   RDW 13.4 04/27/2013 1422   LYMPHSABS 2.7 11/25/2018 1548   MONOABS 0.8 11/25/2018 1548   EOSABS 0.3 11/25/2018 1548   BASOSABS 0.1 11/25/2018 1548    BMET    Component Value Date/Time   NA 141 11/25/2018 1548   NA 140 04/27/2013 1422   K 3.9 11/25/2018 1548   K 3.7 04/27/2013 1422   CL 105 11/25/2018 1548   CL 106  04/27/2013 1422   CO2 25 11/25/2018 1548   CO2 30 04/27/2013 1422   GLUCOSE 85 11/25/2018 1548   GLUCOSE 98 04/27/2013 1422   BUN 19 11/25/2018 1548   BUN 14 04/27/2013 1422   CREATININE 1.17 11/25/2018 1548   CREATININE 0.86 04/27/2013 1422   CALCIUM 9.4 11/25/2018 1548   CALCIUM 8.6 04/27/2013 1422   GFRNONAA >60 04/27/2013 1422   GFRAA >60 04/27/2013 1422    BNP No results found for: BNP  ProBNP No results found for: PROBNP  Specialty Problems      Pulmonary Problems   Loud snoring   Sore throat   OSA on CPAP   Allergic rhinitis   Nasal congestion      Allergies  Allergen Reactions   Haloperidol Lactate     Immunization History  Administered Date(s) Administered   Td 02/02/2000   Tdap 07/22/2017    Past Medical History:  Diagnosis Date   Allergy    Condyloma 2009   Depression     Tobacco History: Social History   Tobacco Use  Smoking Status Former Smoker   Packs/day: 1.50   Years: 18.00   Pack years: 27.00   Types: Cigarettes   Start date: 04/17/1999   Quit date: 04/09/2020   Years since quitting: 0.0  Smokeless Tobacco Never Used  Tobacco Comment   stopped smoking on 04/09/2020   Counseling given: Yes Comment: stopped smoking on 04/09/2020  Smoking assessment and cessation counseling   Patient just stop smoking on 04/09/2020 I have advised the patient to quit/stop smoking as soon as possible due to high risk for multiple medical problems.  It will also be very difficult for [place] to manage patient's  respiratory symptoms and status if we continue to expose her lungs to a known irritant.  We do not advise e-cigarettes as a form of stopping smoking.  Patient is willing to stop smoking.  He stopped smoking on 04/09/2020.  Congratulated patient on stopping smoking.  Also reviewed with patient that should he become interested in smoking cessation therapies we can help him with this.  Offered nicotine replacement therapies, patient  declined this today.  He is aware to contact our office if cravings start to worsen if he feels that he may resume smoking.  I have advised the patient that we can assist and have options of nicotine replacement therapy, provided smoking cessation education today, provided smoking cessation counseling, and provided cessation resources.  Follow-up next office visit office visit for assessment of smoking cessation.    Smoking cessation counseling advised for: 4 min    Outpatient Encounter Medications as of 04/14/2020  Medication Sig   albuterol (PROAIR HFA) 108 (90 Base) MCG/ACT inhaler Inhale 1-2 puffs into the lungs every 6 (six)  hours as needed for wheezing or shortness of breath.   levothyroxine (SYNTHROID) 112 MCG tablet TAKE 1 TABLET BY MOUTH EVERY DAY   montelukast (SINGULAIR) 10 MG tablet TAKE 1 TABLET BY MOUTH EVERYDAY AT BEDTIME   triamcinolone (NASACORT) 55 MCG/ACT AERO nasal inhaler Place 2 sprays into the nose daily.   No facility-administered encounter medications on file as of 04/14/2020.     Review of Systems  Review of Systems  Constitutional: Negative for activity change, chills, fatigue, fever and unexpected weight change.  HENT: Positive for congestion. Negative for postnasal drip, rhinorrhea, sinus pressure, sinus pain and sore throat.   Eyes: Negative.   Respiratory: Negative for cough, shortness of breath and wheezing.   Cardiovascular: Negative for chest pain and palpitations.  Gastrointestinal: Negative for constipation, diarrhea, nausea and vomiting.  Endocrine: Negative.   Genitourinary: Negative.   Musculoskeletal: Negative.   Skin: Negative.   Neurological: Positive for headaches (morning headaches). Negative for dizziness.  Psychiatric/Behavioral: Negative.  Negative for dysphoric mood. The patient is not nervous/anxious.   All other systems reviewed and are negative.    Physical Exam  BP 122/84 (BP Location: Left Arm, Cuff Size: Normal)     Pulse 71    Temp (!) 97.4 F (36.3 C) (Oral)    Ht 6' (1.829 m)    Wt 221 lb 6.4 oz (100.4 kg)    SpO2 98%    BMI 30.03 kg/m   Wt Readings from Last 5 Encounters:  04/14/20 221 lb 6.4 oz (100.4 kg)  04/11/20 219 lb 5 oz (99.5 kg)  07/06/19 205 lb 9 oz (93.2 kg)  04/27/19 212 lb 5 oz (96.3 kg)  02/05/19 220 lb 9.6 oz (100.1 kg)    BMI Readings from Last 5 Encounters:  04/14/20 30.03 kg/m  04/11/20 30.16 kg/m  07/06/19 28.27 kg/m  04/27/19 29.20 kg/m  03/10/19 30.34 kg/m     Physical Exam Vitals and nursing note reviewed.  Constitutional:      General: He is not in acute distress.    Appearance: Normal appearance. He is normal weight.  HENT:     Head: Normocephalic and atraumatic.     Right Ear: Hearing, tympanic membrane, ear canal and external ear normal.     Left Ear: Hearing, tympanic membrane, ear canal and external ear normal.     Nose: Nose normal. No mucosal edema or rhinorrhea.     Right Turbinates: Not enlarged.     Left Turbinates: Not enlarged.     Mouth/Throat:     Mouth: Mucous membranes are dry.     Pharynx: Oropharynx is clear. No oropharyngeal exudate.  Eyes:     Pupils: Pupils are equal, round, and reactive to light.  Cardiovascular:     Rate and Rhythm: Normal rate and regular rhythm.     Pulses: Normal pulses.     Heart sounds: Normal heart sounds. No murmur heard.   Pulmonary:     Effort: Pulmonary effort is normal.     Breath sounds: Normal breath sounds. No decreased breath sounds, wheezing or rales.  Musculoskeletal:     Cervical back: Normal range of motion.     Right lower leg: No edema.     Left lower leg: No edema.  Lymphadenopathy:     Cervical: No cervical adenopathy.  Skin:    General: Skin is warm and dry.     Capillary Refill: Capillary refill takes less than 2 seconds.     Findings: No erythema or rash.  Neurological:     General: No focal deficit present.     Mental Status: He is alert and oriented to person, place, and  time.     Motor: No weakness.     Coordination: Coordination normal.     Gait: Gait is intact. Gait normal.  Psychiatric:        Mood and Affect: Mood normal.        Behavior: Behavior normal. Behavior is cooperative.        Thought Content: Thought content normal.        Judgment: Judgment normal.       Assessment & Plan:   Allergic rhinitis Plan: Continue Singulair Continue Nasacort Start nasal saline rinses Consider resuming daily antihistamine Consider referral to allergist, patient will think about this today and notify our office Patient would likely benefit from formal allergy testing   OSA on CPAP Plan: Continue CPAP therapy We will order overnight oximetry to assess nocturnal hypoxemia  Former smoker Patient recently stop smoking on 04/09/2020 Graduated patient on stopping smoking Encourage patient to continue to not smoke Offered nicotine replacement therapies, patient declined this today  Plan: Continue to not smoke Consider referral to lung cancer screening program when age-appropriate, especially if patient does resume smoking If patient does resume smoking would recommend Pneumovax 23 Patient may benefit from pulmonary function testing in the future if he becomes symptomatic and more short of breath  Healthcare maintenance Unvaccinated seasonal flu Unvaccinated COVID-19 Unvaccinated to pneumonia  Plan: Recommend seasonal flu vaccine, patient declined this today  Recommend COVID-19 vaccination, patient reports that he will think about this.  He is planning on potentially receiving this in the next couple of months especially if his employer requires this.  We reviewed the patient could receive this at a commercial pharmacy.  We also reviewed that we would recommend the patient obtain this as soon as possible.  Reviewed with patient that he would be a candidate for the Pneumovax 23 if he does resume smoking.  Patient is aware to obtain this vaccination  if he continues to smoke.  Nocturnal headaches Plan: We will order overnight oximetry to further investigate morning headaches    Return in about 3 months (around 07/13/2020), or if symptoms worsen or fail to improve, for Follow up with Dr. Wynona Neat.   Coral Ceo, NP 04/14/2020   This appointment required 31 minutes of patient care (this includes precharting, chart review, review of results, face-to-face care, etc.).

## 2020-04-14 NOTE — Assessment & Plan Note (Signed)
Plan: Continue Singulair Continue Nasacort Start nasal saline rinses Consider resuming daily antihistamine Consider referral to allergist, patient will think about this today and notify our office Patient would likely benefit from formal allergy testing

## 2020-04-14 NOTE — Patient Instructions (Addendum)
You were seen today by Coral Ceo, NP  for:   1. OSA on CPAP  - Pulse oximetry, overnight; Future  We recommend that you continue using your CPAP daily >>>Keep up the hard work using your device >>> Goal should be wearing this for the entire night that you are sleeping, at least 4 to 6 hours  Remember:  . Do not drive or operate heavy machinery if tired or drowsy.  . Please notify the supply company and office if you are unable to use your device regularly due to missing supplies or machine being broken.  . Work on maintaining a healthy weight and following your recommended nutrition plan  . Maintain proper daily exercise and movement  . Maintaining proper use of your device can also help improve management of other chronic illnesses such as: Blood pressure, blood sugars, and weight management.   BiPAP/ CPAP Cleaning:  >>>Clean weekly, with Dawn soap, and bottle brush.  Set up to air dry. >>> Wipe mask out daily with wet wipe or towelette   2. Former smoker  Continue to not smoke  3. Nocturnal headaches  - Pulse oximetry, overnight; Future  4. Healthcare maintenance  We recommend the covid19 vaccination   We recommend the seasonal flu vaccination, he declined this today  If potentially you resume smoking he would be a candidate for the Pneumovax 23    5. Seasonal allergic rhinitis due to pollen    We recommend today:  Orders Placed This Encounter  Procedures  . Pulse oximetry, overnight    Standing Status:   Future    Standing Expiration Date:   04/14/2021    Scheduling Instructions:     On RA on cpap   Orders Placed This Encounter  Procedures  . Pulse oximetry, overnight   No orders of the defined types were placed in this encounter.   Follow Up:    Return in about 3 months (around 07/13/2020), or if symptoms worsen or fail to improve, for Follow up with Dr. Wynona Neat.   Notification of test results are managed in the following manner: If there are   any recommendations or changes to the  plan of care discussed in office today,  we will contact you and let you know what they are. If you do not hear from [place], then your results are normal and you can view them through your  MyChart account , or a letter will be sent to you. Thank you again for trusting [place] with your care  - Thank you, [facility]    It is flu season:   >>> Best ways to protect herself from the flu: Receive the yearly flu vaccine, practice good hand hygiene washing with soap and also using hand sanitizer when available, eat a nutritious meals, get adequate rest, hydrate appropriately       Please contact the office if your symptoms worsen or you have concerns that you are not improving.   Thank you for choosing [facility] for your healthcare, and for allowing [place] to partner with you on your healthcare journey. I am thankful to be able to provide care to you today.   Elisha Headland FNP-C

## 2020-04-14 NOTE — Assessment & Plan Note (Signed)
Patient recently stop smoking on 04/09/2020 Graduated patient on stopping smoking Encourage patient to continue to not smoke Offered nicotine replacement therapies, patient declined this today  Plan: Continue to not smoke Consider referral to lung cancer screening program when age-appropriate, especially if patient does resume smoking If patient does resume smoking would recommend Pneumovax 23 Patient may benefit from pulmonary function testing in the future if he becomes symptomatic and more short of breath

## 2020-04-14 NOTE — Assessment & Plan Note (Signed)
Unvaccinated seasonal flu Unvaccinated COVID-19 Unvaccinated to pneumonia  Plan: Recommend seasonal flu vaccine, patient declined this today  Recommend COVID-19 vaccination, patient reports that he will think about this.  He is planning on potentially receiving this in the next couple of months especially if his employer requires this.  We reviewed the patient could receive this at a commercial pharmacy.  We also reviewed that we would recommend the patient obtain this as soon as possible.  Reviewed with patient that he would be a candidate for the Pneumovax 23 if he does resume smoking.  Patient is aware to obtain this vaccination if he continues to smoke.

## 2020-04-20 ENCOUNTER — Other Ambulatory Visit: Payer: Self-pay | Admitting: Family Medicine

## 2020-05-09 ENCOUNTER — Telehealth: Payer: Self-pay

## 2020-05-09 NOTE — Telephone Encounter (Signed)
Pt's mother called requesting info about whether pt paid copay at last visit. Advised front office supervisor would give her a call back.

## 2020-05-11 ENCOUNTER — Telehealth: Payer: Self-pay | Admitting: Pulmonary Disease

## 2020-05-11 NOTE — Telephone Encounter (Signed)
I called and spoke with pt and he stated that he did the test last week and they did pick the machine up.  It still states in the chart that this test needs to be scheduled.  BPM please advise on these results.  Pt is aware that he will receive a call back tomorrow on this.  thanks

## 2020-05-11 NOTE — Telephone Encounter (Signed)
05/11/2020  We will need to coordinate and follow-up with the PCC's.  I have not received any overnight oximetry results on this patient.  Once I have received those then I will place a documented note.  If the patient has already completed this test that we need to follow-up with the DME company and obtain those records.  Wyn Quaker, FNP

## 2020-05-11 NOTE — Telephone Encounter (Signed)
Called and discussed with [place]. Chasen did pay copay on 12/27. She asked about the balance owed and I explained it was from his visit on 12/30 with Wyn Quaker. She verbalized understanding.

## 2020-05-11 NOTE — Telephone Encounter (Signed)
Called and spoke with patient to let him know that we had to have results refaxed over to our office. Results have been located and placed in Brian's mail folder.  Aaron Edelman you are in [place] the next 2 days would you like me to fax you the results?

## 2020-05-12 NOTE — Telephone Encounter (Signed)
Reviewed ONO results with pt. Pt stated understanding. Nothing further needed at this time.

## 2020-05-12 NOTE — Telephone Encounter (Signed)
ONO has been received and given to [facility] for review.

## 2020-05-12 NOTE — Telephone Encounter (Signed)
ONO results have been faxed to the [place] office, confirmation received.

## 2020-05-12 NOTE — Telephone Encounter (Signed)
05/12/20  Overnight oximetry results have been reviewed the listed below:  05/05/2020-overnight oximetry on room air on CPAP-duration of sleep 7 hours and 30 minutes, time spent below 88% 0.2 minutes  Patient does not require oxygen  Continue CPAP therapy  Nothing further needed  Wyn Quaker, FNP

## 2020-05-12 NOTE — Telephone Encounter (Signed)
Sure that would be fine. Will cc Margie.   Wyn Quaker FNP

## 2020-06-07 ENCOUNTER — Encounter: Payer: Self-pay | Admitting: Family Medicine

## 2020-06-07 ENCOUNTER — Ambulatory Visit: Payer: BC Managed Care – PPO | Admitting: Family Medicine

## 2020-06-07 ENCOUNTER — Other Ambulatory Visit: Payer: Self-pay

## 2020-06-07 VITALS — BP 130/78 | HR 97 | Temp 97.4°F | Ht 72.0 in | Wt 224.1 lb

## 2020-06-07 DIAGNOSIS — R1010 Upper abdominal pain, unspecified: Secondary | ICD-10-CM | POA: Insufficient documentation

## 2020-06-07 DIAGNOSIS — R195 Other fecal abnormalities: Secondary | ICD-10-CM | POA: Diagnosis not present

## 2020-06-07 MED ORDER — FAMOTIDINE 20 MG PO TABS: 20.0000 mg | ORAL_TABLET | Freq: Two times a day (BID) | ORAL | 3 refills | Status: DC

## 2020-06-07 NOTE — Patient Instructions (Addendum)
Labs today for abdominal pain   Get citrucel fiber supplment and take it once per day   I sent in generic pepcid to start taking twice daily for acid/gastritis Avoid anti inflammatories if you can  Try to eat more regularly   Follow up in about a month  If worse before then let us know

## 2020-06-07 NOTE — Assessment & Plan Note (Signed)
Both sides  Gastritis is in the differential  Lab today incl cbc, bmet,liver and lipase (drinks 2 beers pre day and seldom nsaid)  Adv eating more balanced diet and more frequently Px pepcid 20 mg bid  F/u 1 mo or sooner if worse  Update if not starting to improve in a week or if worsening

## 2020-06-07 NOTE — Assessment & Plan Note (Signed)
Urgent loose stools, moreso with new job and stress adj to 3rd shift schedule  occ LLQ cramping , reassuring exam Handouts given re: IBS and diet Adv to start citrucel or other fiber suppl daily  Eat a more balanced diet/more fiber  Probiotic /yogurt also F/u 1 mo or earlier if needed  ? If related to upper abd pain  Consider colonoscopy if not improving
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
## 2020-06-08 ENCOUNTER — Other Ambulatory Visit (INDEPENDENT_AMBULATORY_CARE_PROVIDER_SITE_OTHER): Payer: BC Managed Care – PPO

## 2020-06-08 DIAGNOSIS — R195 Other fecal abnormalities: Secondary | ICD-10-CM

## 2020-06-08 DIAGNOSIS — R1010 Upper abdominal pain, unspecified: Secondary | ICD-10-CM

## 2020-06-08 LAB — BASIC METABOLIC PANEL
BUN: 24 mg/dL — ABNORMAL HIGH (ref 6–23)
CO2: 31 mEq/L (ref 19–32)
Calcium: 9.7 mg/dL (ref 8.4–10.5)
Chloride: 104 mEq/L (ref 96–112)
Creatinine, Ser: 1.12 mg/dL (ref 0.40–1.50)
GFR: 84.73 mL/min (ref >60.00)
Glucose, Bld: 93 mg/dL (ref 70–99)
Potassium: 4.1 mEq/L (ref 3.5–5.1)
Sodium: 140 mEq/L (ref 135–145)

## 2020-06-08 LAB — CBC WITH DIFFERENTIAL/PLATELET
Basophils Absolute: 0.1 10*3/uL (ref 0.0–0.1)
Basophils Relative: 0.9 % (ref 0.0–3.0)
Eosinophils Absolute: 0.4 10*3/uL (ref 0.0–0.7)
Eosinophils Relative: 4.9 % (ref 0.0–5.0)
HCT: 40.3 % (ref 39.0–52.0)
Hemoglobin: 14.1 g/dL (ref 13.0–17.0)
Lymphocytes Relative: 37.1 % (ref 12.0–46.0)
Lymphs Abs: 3.4 10*3/uL (ref 0.7–4.0)
MCHC: 35 g/dL (ref 30.0–36.0)
MCV: 92 fl (ref 78.0–100.0)
Monocytes Absolute: 1 10*3/uL (ref 0.1–1.0)
Monocytes Relative: 11.1 % (ref 3.0–12.0)
Neutro Abs: 4.2 10*3/uL (ref 1.4–7.7)
Neutrophils Relative %: 46 % (ref 43.0–77.0)
Platelets: 214 10*3/uL (ref 150.0–400.0)
RBC: 4.38 Mil/uL (ref 4.22–5.81)
RDW: 13.4 % (ref 11.5–15.5)
WBC: 9.1 10*3/uL (ref 4.0–10.5)

## 2020-06-08 LAB — HEPATIC FUNCTION PANEL
ALT: 60 U/L — ABNORMAL HIGH (ref 0–53)
AST: 30 U/L (ref 0–37)
Albumin: 4.6 g/dL (ref 3.5–5.2)
Alkaline Phosphatase: 67 U/L (ref 39–117)
Bilirubin, Direct: 0.1 mg/dL (ref 0.0–0.3)
Total Bilirubin: 0.6 mg/dL (ref 0.2–1.2)
Total Protein: 7.1 g/dL (ref 6.0–8.3)

## 2020-06-08 LAB — LIPASE: Lipase: 12 U/L (ref 11.0–59.0)

## 2020-07-05 ENCOUNTER — Ambulatory Visit: Payer: BC Managed Care – PPO | Admitting: Family Medicine

## 2020-07-22 ENCOUNTER — Encounter: Payer: Self-pay | Admitting: Family Medicine

## 2020-07-22 ENCOUNTER — Ambulatory Visit (INDEPENDENT_AMBULATORY_CARE_PROVIDER_SITE_OTHER): Payer: No Typology Code available for payment source | Admitting: Family Medicine

## 2020-07-22 ENCOUNTER — Other Ambulatory Visit: Payer: Self-pay

## 2020-07-22 VITALS — BP 128/78 | HR 82 | Temp 97.0°F | Ht 72.0 in | Wt 221.4 lb

## 2020-07-22 DIAGNOSIS — E538 Deficiency of other specified B group vitamins: Secondary | ICD-10-CM

## 2020-07-22 DIAGNOSIS — R1010 Upper abdominal pain, unspecified: Secondary | ICD-10-CM | POA: Diagnosis not present

## 2020-07-22 DIAGNOSIS — R5382 Chronic fatigue, unspecified: Secondary | ICD-10-CM

## 2020-07-22 DIAGNOSIS — R195 Other fecal abnormalities: Secondary | ICD-10-CM

## 2020-07-22 DIAGNOSIS — Z91018 Allergy to other foods: Secondary | ICD-10-CM | POA: Insufficient documentation

## 2020-07-22 DIAGNOSIS — E039 Hypothyroidism, unspecified: Secondary | ICD-10-CM

## 2020-07-22 DIAGNOSIS — R7401 Elevation of levels of liver transaminase levels: Secondary | ICD-10-CM | POA: Insufficient documentation

## 2020-07-22 DIAGNOSIS — R350 Frequency of micturition: Secondary | ICD-10-CM | POA: Diagnosis not present

## 2020-07-22 LAB — HEPATIC FUNCTION PANEL
ALT: 66 U/L — ABNORMAL HIGH (ref 0–53)
AST: 31 U/L (ref 0–37)
Albumin: 4.7 g/dL (ref 3.5–5.2)
Alkaline Phosphatase: 68 U/L (ref 39–117)
Bilirubin, Direct: 0.1 mg/dL (ref 0.0–0.3)
Total Bilirubin: 0.6 mg/dL (ref 0.2–1.2)
Total Protein: 7 g/dL (ref 6.0–8.3)

## 2020-07-22 LAB — POC URINALSYSI DIPSTICK (AUTOMATED)
Blood, UA: NEGATIVE
Glucose, UA: NEGATIVE
Ketones, UA: 5
Leukocytes, UA: NEGATIVE
Nitrite, UA: NEGATIVE
Protein, UA: POSITIVE — AB
Spec Grav, UA: 1.025 (ref 1.010–1.025)
Urobilinogen, UA: 0.2 E.U./dL
pH, UA: 6.5 (ref 5.0–8.0)

## 2020-07-22 LAB — VITAMIN B12: Vitamin B-12: 1331 pg/mL — ABNORMAL HIGH (ref 211–911)

## 2020-07-22 LAB — TSH: TSH: 6.33 u[IU]/mL — ABNORMAL HIGH (ref 0.35–4.50)

## 2020-07-22 NOTE — Patient Instructions (Addendum)
Make an effort to take the pepcid twice daily (set a cell phone reminder)  If symptoms do not go away let me know   The less alcohol you drink the better   I want to refer you to GI for the abdominal issues  You will get a call   Urinalysis today   Labs for liver and thyroid and B12   Let's consider allergist referral later
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
## 2020-07-24 NOTE — Assessment & Plan Note (Signed)
ua today is clear with some protein Monogamous - doubts std (and no dysuria) Labs done

## 2020-07-24 NOTE — Assessment & Plan Note (Signed)
Ongoing urgent and loose stools  Ref made to GI  No food triggers known  No imp with fiber supplement and probiotic

## 2020-07-24 NOTE — Assessment & Plan Note (Signed)
Some fatigue despite good cpap use and effectiveness Will continue to follow labs Better diet and less etoh may help  Also working 3rd shift and adjusting to it

## 2020-07-24 NOTE — Assessment & Plan Note (Signed)
Per pt had reaction to jack fruit Would like allergist referral eventually  Wants to addres GI issues first

## 2020-07-24 NOTE — Assessment & Plan Note (Signed)
Suspect this is from etoh intake  He cut back significantly  Now 24 beers per week (still too much)  Enc him to cut back to 2 or less per day (none is better)  He does not feel dependent

## 2020-07-24 NOTE — Assessment & Plan Note (Signed)
Significant improvement with pepcid -but not compliant with 2nd dose  Avoids nsaids Stressed importance of bid dosing and suggested using a reminder /phone  inst to alert [place] if not symptom free after a few weeks Better diet would also help

## 2020-07-24 NOTE — Assessment & Plan Note (Signed)
Hypothyroidism  Pt has no clinical changes (still chronically fatigued but less so) No change in energy level/ hair or skin/ edema and no tremor TSH last 1.44 on levothyroxine 112 mcg daily  Lab today for TSH

## 2020-07-24 NOTE — Assessment & Plan Note (Signed)
Had one B12 shot and now taking 1000 mcg daily  Lab today

## 2020-07-25 ENCOUNTER — Telehealth: Payer: Self-pay | Admitting: *Deleted

## 2020-07-25 NOTE — Telephone Encounter (Signed)
-----   Message from Abner Greenspan, MD sent at 07/24/2020  2:02 PM EDT ----- Urine is clear but there was some mild protein  I would like to re check this when he has had a good amount of water-may have been a bit dehydrated because SG was high and there were some ketones ALT for liver still very slightly high  Please try to keep cutting alcohol intake (optimally less than 2 drinks per day) TSH is up again  Increase levothyroxine from 112 mcg to 125 mcg (please send in 30 with 3 refills)  Re check TSH in about a month  B12 level is up , continue to take oral B12- can reduce dose to 500 mcg daily

## 2020-07-25 NOTE — Telephone Encounter (Signed)
Left VM requesting pt to call the office back 

## 2020-07-26 ENCOUNTER — Encounter: Payer: Self-pay | Admitting: *Deleted

## 2020-07-27 NOTE — Telephone Encounter (Signed)
Left 2nd VM requesting pt to call the office back  

## 2020-08-02 ENCOUNTER — Encounter: Payer: Self-pay | Admitting: *Deleted

## 2020-08-02 MED ORDER — LEVOTHYROXINE SODIUM 125 MCG PO TABS: 125.0000 ug | ORAL_TABLET | Freq: Every day | ORAL | 3 refills | Status: DC

## 2020-08-03 NOTE — Telephone Encounter (Signed)
See result notes, pt never responded to my VM so letter mailed with labs and Dr. Marliss Coots comments and Rx sent to pharmacy

## 2020-08-18 ENCOUNTER — Telehealth: Payer: Self-pay

## 2020-08-18 NOTE — Telephone Encounter (Signed)
Please open referral and attach his new patient appointment, please.

## 2020-09-03 ENCOUNTER — Other Ambulatory Visit: Payer: Self-pay | Admitting: Family Medicine

## 2020-09-06 ENCOUNTER — Telehealth: Payer: Self-pay | Admitting: Family Medicine

## 2020-09-06 DIAGNOSIS — E039 Hypothyroidism, unspecified: Secondary | ICD-10-CM

## 2020-09-06 DIAGNOSIS — R809 Proteinuria, unspecified: Secondary | ICD-10-CM | POA: Insufficient documentation

## 2020-09-06 DIAGNOSIS — R7401 Elevation of levels of liver transaminase levels: Secondary | ICD-10-CM

## 2020-09-06 NOTE — Telephone Encounter (Signed)
-----   Message from Ellamae Sia sent at 08/22/2020 11:06 AM EDT ----- Regarding: Lab orders for Wednesday, 5.25.22 Lab and urine orders, thanks, T

## 2020-09-07 ENCOUNTER — Other Ambulatory Visit (INDEPENDENT_AMBULATORY_CARE_PROVIDER_SITE_OTHER): Payer: No Typology Code available for payment source

## 2020-09-07 ENCOUNTER — Other Ambulatory Visit: Payer: Self-pay

## 2020-09-07 DIAGNOSIS — R7401 Elevation of levels of liver transaminase levels: Secondary | ICD-10-CM | POA: Diagnosis not present

## 2020-09-07 DIAGNOSIS — E039 Hypothyroidism, unspecified: Secondary | ICD-10-CM

## 2020-09-07 DIAGNOSIS — R809 Proteinuria, unspecified: Secondary | ICD-10-CM | POA: Diagnosis not present

## 2020-09-07 LAB — POC URINALSYSI DIPSTICK (AUTOMATED)
Bilirubin, UA: NEGATIVE
Blood, UA: NEGATIVE
Glucose, UA: NEGATIVE
Ketones, UA: NEGATIVE
Leukocytes, UA: NEGATIVE
Nitrite, UA: NEGATIVE
Protein, UA: NEGATIVE
Spec Grav, UA: >=1.03 — AB (ref 1.010–1.025)
Urobilinogen, UA: 0.2 E.U./dL
pH, UA: 6 (ref 5.0–8.0)

## 2020-09-07 LAB — HEPATIC FUNCTION PANEL
ALT: 70 U/L — ABNORMAL HIGH (ref 0–53)
AST: 30 U/L (ref 0–37)
Albumin: 4.5 g/dL (ref 3.5–5.2)
Alkaline Phosphatase: 63 U/L (ref 39–117)
Bilirubin, Direct: 0.1 mg/dL (ref 0.0–0.3)
Total Bilirubin: 0.5 mg/dL (ref 0.2–1.2)
Total Protein: 6.6 g/dL (ref 6.0–8.3)

## 2020-09-07 LAB — TSH: TSH: 7.79 u[IU]/mL — ABNORMAL HIGH (ref 0.35–4.50)

## 2020-09-14 ENCOUNTER — Other Ambulatory Visit: Payer: Self-pay

## 2020-09-14 ENCOUNTER — Encounter: Payer: Self-pay | Admitting: Gastroenterology

## 2020-09-14 ENCOUNTER — Ambulatory Visit (INDEPENDENT_AMBULATORY_CARE_PROVIDER_SITE_OTHER): Payer: No Typology Code available for payment source | Admitting: Gastroenterology

## 2020-09-14 VITALS — BP 120/82 | HR 69 | Wt 221.6 lb

## 2020-09-14 DIAGNOSIS — R7401 Elevation of levels of liver transaminase levels: Secondary | ICD-10-CM

## 2020-09-14 DIAGNOSIS — R197 Diarrhea, unspecified: Secondary | ICD-10-CM | POA: Diagnosis not present

## 2020-09-14 DIAGNOSIS — R109 Unspecified abdominal pain: Secondary | ICD-10-CM | POA: Diagnosis not present
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
## 2020-09-16 LAB — H PYLORI, IGM, IGG, IGA AB
H pylori, IgM Abs: <9 units (ref 0.0–8.9)
H. pylori, IgA Abs: <9 units (ref 0.0–8.9)
H. pylori, IgG AbS: 0.25 Index Value (ref 0.00–0.79)

## 2020-09-16 LAB — C-REACTIVE PROTEIN: CRP: 2 mg/L (ref 0–10)

## 2020-09-19 ENCOUNTER — Other Ambulatory Visit: Payer: Self-pay | Admitting: Gastroenterology

## 2020-09-19 DIAGNOSIS — R109 Unspecified abdominal pain: Secondary | ICD-10-CM

## 2020-09-23 LAB — CALPROTECTIN, FECAL: Calprotectin, Fecal: <16 ug/g (ref 0–120)

## 2020-09-27 ENCOUNTER — Telehealth: Payer: Self-pay | Admitting: Family Medicine

## 2020-09-27 ENCOUNTER — Encounter: Payer: Self-pay | Admitting: Gastroenterology

## 2020-09-27 DIAGNOSIS — E039 Hypothyroidism, unspecified: Secondary | ICD-10-CM

## 2020-09-27 DIAGNOSIS — R7401 Elevation of levels of liver transaminase levels: Secondary | ICD-10-CM

## 2020-09-27 NOTE — Telephone Encounter (Signed)
-----   Message from Ellamae Sia sent at 09/13/2020  1:49 PM EDT ----- Regarding: lab orders for Wednesday, 6.15.22 Lab orders, thanks

## 2020-09-27 NOTE — Addendum Note (Signed)
Addended by: Vonda Antigua on: 09/27/2020 10:54 AM   Modules accepted: Orders

## 2020-09-28 ENCOUNTER — Other Ambulatory Visit: Payer: Self-pay

## 2020-09-28 ENCOUNTER — Other Ambulatory Visit (INDEPENDENT_AMBULATORY_CARE_PROVIDER_SITE_OTHER): Payer: No Typology Code available for payment source

## 2020-09-28 DIAGNOSIS — E039 Hypothyroidism, unspecified: Secondary | ICD-10-CM | POA: Diagnosis not present

## 2020-09-28 DIAGNOSIS — R7401 Elevation of levels of liver transaminase levels: Secondary | ICD-10-CM

## 2020-09-28 DIAGNOSIS — R109 Unspecified abdominal pain: Secondary | ICD-10-CM

## 2020-09-28 LAB — HEPATIC FUNCTION PANEL
ALT: 66 U/L — ABNORMAL HIGH (ref 0–53)
AST: 27 U/L (ref 0–37)
Albumin: 4.4 g/dL (ref 3.5–5.2)
Alkaline Phosphatase: 62 U/L (ref 39–117)
Bilirubin, Direct: 0.1 mg/dL (ref 0.0–0.3)
Total Bilirubin: 0.7 mg/dL (ref 0.2–1.2)
Total Protein: 6.5 g/dL (ref 6.0–8.3)

## 2020-09-28 LAB — TSH: TSH: 4.01 u[IU]/mL (ref 0.35–4.50)

## 2020-09-29 ENCOUNTER — Telehealth: Payer: Self-pay | Admitting: *Deleted

## 2020-09-29 LAB — COMPREHENSIVE METABOLIC PANEL
ALT: 73 IU/L — ABNORMAL HIGH (ref 0–44)
AST: 32 IU/L (ref 0–40)
Albumin/Globulin Ratio: 2.1 (ref 1.2–2.2)
Albumin: 4.5 g/dL (ref 4.0–5.0)
Alkaline Phosphatase: 75 IU/L (ref 44–121)
BUN/Creatinine Ratio: 16 (ref 9–20)
BUN: 16 mg/dL (ref 6–20)
Bilirubin Total: 0.5 mg/dL (ref 0.0–1.2)
CO2: 23 mmol/L (ref 20–29)
Calcium: 9 mg/dL (ref 8.7–10.2)
Chloride: 105 mmol/L (ref 96–106)
Creatinine, Ser: 0.99 mg/dL (ref 0.76–1.27)
Globulin, Total: 2.1 g/dL (ref 1.5–4.5)
Glucose: 116 mg/dL — ABNORMAL HIGH (ref 65–99)
Potassium: 4.1 mmol/L (ref 3.5–5.2)
Sodium: 144 mmol/L (ref 134–144)
Total Protein: 6.6 g/dL (ref 6.0–8.5)
eGFR: 101 mL/min/{1.73_m2} (ref >59)

## 2020-09-29 LAB — CBC
Hematocrit: 43.3 % (ref 37.5–51.0)
Hemoglobin: 14.8 g/dL (ref 13.0–17.7)
MCH: 31.5 pg (ref 26.6–33.0)
MCHC: 34.2 g/dL (ref 31.5–35.7)
MCV: 92 fL (ref 79–97)
Platelets: 194 10*3/uL (ref 150–450)
RBC: 4.7 x10E6/uL (ref 4.14–5.80)
RDW: 12.7 % (ref 11.6–15.4)
WBC: 7.1 10*3/uL (ref 3.4–10.8)

## 2020-09-29 LAB — SEDIMENTATION RATE: Sed Rate: 3 mm/hr (ref 0–15)

## 2020-09-29 NOTE — Telephone Encounter (Signed)
Patient called in returning call. Would like a call back

## 2020-09-29 NOTE — Telephone Encounter (Signed)
Left VM requesting pt to call the office back and/or check mychart message

## 2020-09-29 NOTE — Telephone Encounter (Signed)
-----   Message from Abner Greenspan, MD sent at 09/28/2020  8:28 PM EDT ----- Tsh is therapeutic  ALT back down to 66  Keep working to reduce alcohol intake and I would like to re check liver labs in about 2 months

## 2020-09-30 ENCOUNTER — Telehealth: Payer: Self-pay

## 2020-09-30 NOTE — Telephone Encounter (Signed)
Patient verbalized understanding and would like to get the RUQ ultrasound schedule. Called and got patient [facility] for 11/02/2020 [place] [facility]. Nothing to eat or drink after midnight. Informed patient of this information.

## 2020-09-30 NOTE — Telephone Encounter (Signed)
-----   Message from Virgel Manifold, MD sent at 09/30/2020 10:11 AM EDT ----- Please let the patient know his liver enzymes are still elevated. I see the RUQ U/S in the orders. I would recommend scheduling if not already done. Thank you

## 2020-09-30 NOTE — Telephone Encounter (Signed)
Patient called and informed of his lab results. Patient stated he will call back and set up an appointment for his labs for 2-3 months.

## 2020-10-23 ENCOUNTER — Other Ambulatory Visit: Payer: Self-pay | Admitting: Family Medicine

## 2020-10-28 ENCOUNTER — Other Ambulatory Visit: Payer: Self-pay | Admitting: Family Medicine

## 2020-11-02 ENCOUNTER — Ambulatory Visit
Admission: RE | Admit: 2020-11-02 | Discharge: 2020-11-02 | Disposition: A | Discharge status: Home / Self Care | Payer: PRIVATE HEALTH INSURANCE | Source: Ambulatory Visit | Attending: Gastroenterology | Admitting: Gastroenterology

## 2020-11-02 ENCOUNTER — Other Ambulatory Visit: Payer: Self-pay

## 2020-11-02 DIAGNOSIS — R109 Unspecified abdominal pain: Secondary | ICD-10-CM | POA: Diagnosis not present

## 2020-11-03 ENCOUNTER — Other Ambulatory Visit: Payer: Self-pay | Admitting: Gastroenterology

## 2020-11-03 DIAGNOSIS — K760 Fatty (change of) liver, not elsewhere classified: Secondary | ICD-10-CM

## 2020-11-03 DIAGNOSIS — R932 Abnormal findings on diagnostic imaging of liver and biliary tract: Secondary | ICD-10-CM

## 2020-11-09 ENCOUNTER — Encounter: Payer: Self-pay | Admitting: Gastroenterology

## 2020-11-12 LAB — HEPATITIS B CORE ANTIBODY, TOTAL: Hep B Core Total Ab: NEGATIVE

## 2020-11-12 LAB — HEPATITIS B SURFACE ANTIBODY,QUALITATIVE: Hep B Surface Ab, Qual: REACTIVE

## 2020-11-12 LAB — HEPATITIS B SURFACE ANTIGEN: Hepatitis B Surface Ag: NEGATIVE

## 2020-11-12 LAB — ANA: ANA Titer 1: NEGATIVE

## 2020-11-12 LAB — HEPATITIS C ANTIBODY: Hep C Virus Ab: <0.1 s/co ratio (ref 0.0–0.9)

## 2020-11-12 LAB — IRON AND TIBC
Iron Saturation: 43 % (ref 15–55)
Iron: 118 ug/dL (ref 38–169)
Total Iron Binding Capacity: 277 ug/dL (ref 250–450)
UIBC: 159 ug/dL (ref 111–343)

## 2020-11-12 LAB — IGG: IgG (Immunoglobin G), Serum: 828 mg/dL (ref 603–1613)

## 2020-11-12 LAB — MITOCHONDRIAL/SMOOTH MUSCLE AB PNL
Mitochondrial Ab: <20 Units (ref 0.0–20.0)
Smooth Muscle Ab: 6 Units (ref 0–19)

## 2020-11-12 LAB — ANTI-MICROSOMAL ANTIBODY LIVER / KIDNEY: LKM1 Ab: 0.8 Units (ref 0.0–20.0)

## 2020-11-12 LAB — FERRITIN: Ferritin: 307 ng/mL (ref 30–400)

## 2020-11-12 LAB — CERULOPLASMIN: Ceruloplasmin: 20.5 mg/dL (ref 16.0–31.0)

## 2020-11-12 LAB — HEPATITIS A ANTIBODY, TOTAL: hep A Total Ab: NEGATIVE

## 2020-11-16 NOTE — Addendum Note (Signed)
Addended by: Lurlean Nanny on: 11/16/2020 10:45 AM   Modules accepted: Orders

## 2020-11-23 ENCOUNTER — Ambulatory Visit: Payer: Self-pay | Admitting: Surgery

## 2020-11-28 ENCOUNTER — Other Ambulatory Visit: Payer: Self-pay

## 2020-11-28 ENCOUNTER — Encounter: Payer: Self-pay | Admitting: Surgery

## 2020-11-28 ENCOUNTER — Ambulatory Visit: Payer: No Typology Code available for payment source | Admitting: Surgery

## 2020-11-28 VITALS — BP 135/90 | HR 73 | Temp 98.7°F | Ht 72.0 in | Wt 228.8 lb

## 2020-11-28 DIAGNOSIS — R109 Unspecified abdominal pain: Secondary | ICD-10-CM | POA: Diagnosis not present

## 2020-11-28 NOTE — Patient Instructions (Addendum)
If you have any concerns or questions, please feel free to call our office.   Gallbladder Eating Plan  If you have a gallbladder condition, you may have trouble digesting fats. Eating a low-fat diet can help reduce your symptoms, and may be helpful before and after having surgery to remove your gallbladder (cholecystectomy). Your health care provider may recommend that you work with a diet and nutrition specialist (dietitian) to help you reduce the amount of fat in your diet. What are tips for following this plan? General guidelines Limit your fat intake to less than 30% of your total daily calories. If you eat around 1,800 calories each day, this is less than 60 grams (g) of fat per day. Fat is an important part of a healthy diet. Eating a low-fat diet can make it hard to maintain a healthy body weight. Ask your dietitian how much fat, calories, and other nutrients you need each day. Eat small, frequent meals throughout the day instead of three large meals. Drink at least 8-10 cups of fluid a day. Drink enough fluid to keep your urine clear or pale yellow. Limit alcohol intake to no more than 1 drink a day for nonpregnant women and 2 drinks a day for men. One drink equals 12 oz of beer, 5 oz of wine, or 1 oz of hard liquor. Reading food labels  Check Nutrition Facts on food labels for the amount of fat per serving. Choose foods with less than 3 grams of fat per serving.  Shopping Choose nonfat and low-fat healthy foods. Look for the words "nonfat," "low fat," or "fat free." Avoid buying processed or prepackaged foods. Cooking Cook using low-fat methods, such as baking, broiling, grilling, or boiling. Cook with small amounts of healthy fats, such as olive oil, grapeseed oil, canola oil, or sunflower oil. What foods are recommended? All fresh, frozen, or canned fruits and vegetables. Whole grains. Low-fat or non-fat (skim) milk and yogurt. Lean meat, skinless poultry, fish, eggs, and  beans. Low-fat protein supplement powders or drinks. Spices and herbs. What foods are not recommended? High-fat foods. These include baked goods, fast food, fatty cuts of meat, ice cream, french toast, sweet rolls, pizza, cheese bread, foods covered with butter, creamy sauces, or cheese. Fried foods. These include french fries, tempura, battered fish, breaded chicken, fried breads, and sweets. Foods with strong odors. Foods that cause bloating and gas. Summary A low-fat diet can be helpful if you have a gallbladder condition, or before and after gallbladder surgery. Limit your fat intake to less than 30% of your total daily calories. This is about 60 g of fat if you eat 1,800 calories each day. Eat small, frequent meals throughout the day instead of three large meals. This information is not intended to replace advice given to you by your health care provider. Make sure you discuss any questions you have with your healthcare provider. Document Revised: 11/19/2019 Document Reviewed: 11/19/2019 Elsevier Patient Education  2022 [employer].
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
## 2020-12-22 ENCOUNTER — Ambulatory Visit: Payer: No Typology Code available for payment source | Admitting: Gastroenterology

## 2020-12-22 ENCOUNTER — Encounter: Payer: Self-pay | Admitting: Gastroenterology

## 2020-12-22 ENCOUNTER — Other Ambulatory Visit: Payer: Self-pay

## 2020-12-22 VITALS — BP 123/85 | HR 69 | Temp 98.9°F | Ht 72.0 in | Wt 231.0 lb

## 2020-12-22 DIAGNOSIS — R109 Unspecified abdominal pain: Secondary | ICD-10-CM | POA: Diagnosis not present

## 2020-12-22 DIAGNOSIS — R7401 Elevation of levels of liver transaminase levels: Secondary | ICD-10-CM

## 2020-12-22 DIAGNOSIS — R197 Diarrhea, unspecified: Secondary | ICD-10-CM

## 2020-12-22 MED ORDER — NA SULFATE-K SULFATE-MG SULF 17.5-3.13-1.6 GM/177ML PO SOLN: 1.0000 | Freq: Once | ORAL | 0 refills | Status: AC
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
## 2020-12-23 ENCOUNTER — Telehealth: Payer: Self-pay | Admitting: Gastroenterology

## 2020-12-23 LAB — HEPATIC FUNCTION PANEL
ALT: 80 IU/L — ABNORMAL HIGH (ref 0–44)
AST: 35 IU/L (ref 0–40)
Albumin: 4.8 g/dL (ref 4.0–5.0)
Alkaline Phosphatase: 81 IU/L (ref 44–121)
Bilirubin Total: 0.5 mg/dL (ref 0.0–1.2)
Bilirubin, Direct: 0.18 mg/dL (ref 0.00–0.40)
Total Protein: 7.1 g/dL (ref 6.0–8.5)

## 2020-12-23 NOTE — Telephone Encounter (Signed)
Patient's mother wants to change procedure date for patient and wants a returned phone call [phone].Marland Kitchen

## 2021-01-06 ENCOUNTER — Telehealth: Payer: Self-pay

## 2021-01-06 NOTE — Telephone Encounter (Signed)
Left message on voicemail requesting call back as she has questions related to pt's procedure .

## 2021-01-06 NOTE — Telephone Encounter (Signed)
Noah [name], mother, had questions in reference to the anesthesiologists and procedure.... number to endo unit given and she is aware that this is the office number and we do not have anything to do with the endoscopy unit

## 2021-01-11 ENCOUNTER — Ambulatory Visit
Admission: RE | Admit: 2021-01-11 | Discharge: 2021-01-11 | Disposition: A | Discharge status: Home / Self Care | Payer: PRIVATE HEALTH INSURANCE | Attending: Gastroenterology | Admitting: Gastroenterology

## 2021-01-11 ENCOUNTER — Ambulatory Visit: Payer: PRIVATE HEALTH INSURANCE | Admitting: Certified Registered Nurse Anesthetist

## 2021-01-11 ENCOUNTER — Encounter
Admission: RE | Disposition: A | Discharge status: Home / Self Care | Payer: Self-pay | Source: Home / Self Care | Attending: Gastroenterology

## 2021-01-11 DIAGNOSIS — K635 Polyp of colon: Secondary | ICD-10-CM | POA: Diagnosis not present

## 2021-01-11 DIAGNOSIS — Z79890 Hormone replacement therapy (postmenopausal): Secondary | ICD-10-CM | POA: Diagnosis not present

## 2021-01-11 DIAGNOSIS — Z6831 Body mass index (BMI) 31.0-31.9, adult: Secondary | ICD-10-CM | POA: Insufficient documentation

## 2021-01-11 DIAGNOSIS — D120 Benign neoplasm of cecum: Secondary | ICD-10-CM | POA: Diagnosis not present

## 2021-01-11 DIAGNOSIS — D123 Benign neoplasm of transverse colon: Secondary | ICD-10-CM | POA: Diagnosis not present

## 2021-01-11 DIAGNOSIS — Z87891 Personal history of nicotine dependence: Secondary | ICD-10-CM | POA: Insufficient documentation

## 2021-01-11 DIAGNOSIS — D124 Benign neoplasm of descending colon: Secondary | ICD-10-CM | POA: Insufficient documentation

## 2021-01-11 DIAGNOSIS — K529 Noninfective gastroenteritis and colitis, unspecified: Secondary | ICD-10-CM

## 2021-01-11 DIAGNOSIS — D125 Benign neoplasm of sigmoid colon: Secondary | ICD-10-CM | POA: Insufficient documentation

## 2021-01-11 DIAGNOSIS — E669 Obesity, unspecified: Secondary | ICD-10-CM | POA: Insufficient documentation

## 2021-01-11 DIAGNOSIS — K6389 Other specified diseases of intestine: Secondary | ICD-10-CM | POA: Insufficient documentation

## 2021-01-11 DIAGNOSIS — R197 Diarrhea, unspecified: Secondary | ICD-10-CM | POA: Diagnosis not present

## 2021-01-11 DIAGNOSIS — Z79899 Other long term (current) drug therapy: Secondary | ICD-10-CM | POA: Insufficient documentation

## 2021-01-11 HISTORY — PX: COLONOSCOPY WITH PROPOFOL: SHX5780

## 2021-01-11 SURGERY — COLONOSCOPY WITH PROPOFOL
Anesthesia: General

## 2021-01-11 MED ORDER — PROPOFOL 500 MG/50ML IV EMUL
INTRAVENOUS | Status: AC
  Filled 2021-01-11: qty 50

## 2021-01-11 MED ORDER — PROPOFOL 10 MG/ML IV BOLUS
INTRAVENOUS | Status: DC | PRN
Start: 2021-01-11 — End: 2021-01-11
  Administered 2021-01-11: 60 mg via INTRAVENOUS
  Administered 2021-01-11: 40 mg via INTRAVENOUS

## 2021-01-11 MED ORDER — MIDAZOLAM HCL 2 MG/2ML IJ SOLN
INTRAMUSCULAR | Status: DC | PRN
  Administered 2021-01-11: 2 mg via INTRAVENOUS

## 2021-01-11 MED ORDER — PROPOFOL 10 MG/ML IV BOLUS
INTRAVENOUS | Status: AC
  Filled 2021-01-11: qty 20

## 2021-01-11 MED ORDER — LIDOCAINE HCL (CARDIAC) PF 100 MG/5ML IV SOSY
PREFILLED_SYRINGE | INTRAVENOUS | Status: DC | PRN
  Administered 2021-01-11: 50 mg via INTRAVENOUS

## 2021-01-11 MED ORDER — PROPOFOL 500 MG/50ML IV EMUL
INTRAVENOUS | Status: DC | PRN
  Administered 2021-01-11: 150 ug/kg/min via INTRAVENOUS

## 2021-01-11 MED ORDER — SODIUM CHLORIDE 0.9 % IV SOLN
INTRAVENOUS | Status: DC
  Administered 2021-01-11: 20 mL/h via INTRAVENOUS

## 2021-01-11 MED ORDER — MIDAZOLAM HCL 2 MG/2ML IJ SOLN
INTRAMUSCULAR | Status: AC
  Filled 2021-01-11: qty 2

## 2021-01-11 NOTE — Anesthesia Postprocedure Evaluation (Signed)
Anesthesia Post Note  Patient: Noah [name]  Procedure(s) Performed: COLONOSCOPY WITH PROPOFOL  Patient location during evaluation: Endoscopy Anesthesia Type: General Level of consciousness: awake and alert and oriented Pain management: pain level controlled Vital Signs Assessment: post-procedure vital signs reviewed and stable Respiratory status: spontaneous breathing, nonlabored ventilation and respiratory function stable Cardiovascular status: blood pressure returned to baseline and stable Postop Assessment: no signs of nausea or vomiting Anesthetic complications: no   No notable events documented.   Last Vitals:  Vitals:   01/11/21 1113 01/11/21 1123  BP: 126/73 125/79  Pulse: 69 76  Resp: (!) 24   Temp:    SpO2: 98% 98%    Last Pain:  Vitals:   01/11/21 1123  TempSrc:   PainSc: 0-No pain                 Jevaeh Shams

## 2021-01-11 NOTE — Op Note (Signed)
[facility] Patient Name: Noah [name] Procedure Date: 01/11/2021 10:22 AM MRN: [id number] Account #: [id number] Date of Birth: [birth year] Admit Type: Outpatient Age: 35 Room: [facility] ENDO ROOM 3 Gender: Male Note Status: Finalized Instrument Name: Jasper Riling [id number] Procedure:             Colonoscopy Indications:           Clinically significant diarrhea of unexplained origin Providers:             Jacklynn Dehaas B. Bonna Gains MD, MD Medicines:             Monitored Anesthesia Care Complications:         No immediate complications. Procedure:             Pre-Anesthesia Assessment:                        - ASA Grade Assessment: II - A patient with mild                         systemic disease.                        - Prior to the procedure, a History and Physical was                         performed, and patient medications, allergies and                         sensitivities were reviewed. The patient's tolerance                         of previous anesthesia was reviewed.                        - The risks and benefits of the procedure and the                         sedation options and risks were discussed with the                         patient. All questions were answered and informed                         consent was obtained.                        - Patient identification and proposed procedure were                         verified prior to the procedure by the physician, the                         nurse, the anesthesiologist, the anesthetist and the                         technician. The procedure was verified in the                         procedure room.  After obtaining informed consent, the colonoscope was                         passed under direct vision. Throughout the procedure,                         the patient's blood pressure, pulse, and oxygen                         saturations were monitored  continuously. The                         Colonoscope was introduced through the anus and                         advanced to the the terminal ileum. The colonoscopy                         was performed with ease. The patient tolerated the                         procedure well. The quality of the bowel preparation                         was good. Findings:      The perianal and digital rectal examinations were normal.      A patchy area of mucosa in the terminal ileum was mildly erythematous.       Biopsies were taken with a cold forceps for histology.      A 4 mm polyp was found in the cecum. The polyp was sessile. The polyp       was removed with a cold snare. Resection and retrieval were complete.      Five sessile polyps were found in the transverse colon. The polyps were       4 to 6 mm in size. These polyps were removed with a cold snare.       Resection and retrieval were complete.      A 5 mm polyp was found in the descending colon. The polyp was sessile.       The polyp was removed with a cold snare. Resection and retrieval were       complete.      A 4 mm polyp was found in the sigmoid colon. The polyp was sessile. The       polyp was removed with a cold biopsy forceps. Resection and retrieval       were complete.      The exam was otherwise without abnormality.      The rectum, sigmoid colon, descending colon, transverse colon, ascending       colon and cecum appeared normal. Biopsies for histology were taken with       a cold forceps from the entire colon for evaluation of microscopic       colitis.      The retroflexed view of the distal rectum and anal verge was normal and       showed no anal or rectal abnormalities. Impression:            - Erythematous mucosa in the terminal ileum. Biopsied.                        -  One 4 mm polyp in the cecum, removed with a cold                         snare. Resected and retrieved.                        - Five 4 to 6 mm polyps  in the transverse colon,                         removed with a cold snare. Resected and retrieved.                        - One 5 mm polyp in the descending colon, removed with                         a cold snare. Resected and retrieved.                        - One 4 mm polyp in the sigmoid colon, removed with a                         cold biopsy forceps. Resected and retrieved.                        - The examination was otherwise normal.                        - The rectum, sigmoid colon, descending colon,                         transverse colon, ascending colon and cecum are                         normal. Biopsied.                        - The distal rectum and anal verge are normal on                         retroflexion view. Recommendation:        - Await pathology results.                        - Discharge patient to home (with escort).                        - Advance diet as tolerated.                        - Continue present medications.                        - Repeat colonoscopy date to be determined after                         pending pathology results are reviewed.                        - The findings and recommendations were discussed with  the patient.                        - The findings and recommendations were discussed with                         the patient's family.                        - Return to primary care physician as previously                         scheduled. Procedure Code(s):     --- Professional ---                        [phone], Colonoscopy, flexible; with removal of                         tumor(s), polyp(s), or other lesion(s) by snare                         technique                        45380, 41, Colonoscopy, flexible; with biopsy, single                         or multiple Diagnosis Code(s):     --- Professional ---                        K63.89, Other specified diseases of intestine                         K63.5, Polyp of colon                        R19.7, Diarrhea, unspecified CPT copyright 2019 American Medical Association. All rights reserved. The codes documented in this report are preliminary and upon coder review may  be revised to meet current compliance requirements.  Vonda Antigua, MD Margretta Sidle B. Bonna Gains MD, MD 01/11/2021 11:08:34 AM This report has been signed electronically. Number of Addenda: 0 Note Initiated On: 01/11/2021 10:22 AM Scope Withdrawal Time: 0 hours 26 minutes 40 seconds  Total Procedure Duration: 0 hours 32 minutes 21 seconds  Estimated Blood Loss:  Estimated blood loss: none.      [facility]
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
## 2021-01-11 NOTE — Anesthesia Preprocedure Evaluation (Signed)
Anesthesia Evaluation  Patient identified by MRN, date of birth, ID band Patient awake    Reviewed: Allergy & Precautions, NPO status , Patient's Chart, lab work & pertinent test results  History of Anesthesia Complications Negative for: history of anesthetic complications  Airway Mallampati: III  TM Distance: >3 FB Neck ROM: Full    Dental no notable dental hx.    Pulmonary sleep apnea , neg COPD, former smoker,    breath sounds clear to auscultation- rhonchi (-) wheezing      Cardiovascular Exercise Tolerance: Good (-) hypertension(-) CAD, (-) Past MI, (-) Cardiac Stents and (-) CABG  Rhythm:Regular Rate:Normal - Systolic murmurs and - Diastolic murmurs    Neuro/Psych  Headaches, neg Seizures PSYCHIATRIC DISORDERS Depression    GI/Hepatic negative GI ROS, Neg liver ROS,   Endo/Other  neg diabetesHypothyroidism   Renal/GU negative Renal ROS     Musculoskeletal negative musculoskeletal ROS (+)   Abdominal (+) + obese,   Peds  Hematology negative hematology ROS (+)   Anesthesia Other Findings Past Medical History: No date: Allergy 2009: Condyloma No date: Depression   Reproductive/Obstetrics                             Anesthesia Physical Anesthesia Plan  ASA: 2  Anesthesia Plan: General   Post-op Pain Management:    Induction: Intravenous  PONV Risk Score and Plan: 1 and Propofol infusion  Airway Management Planned: Natural Airway  Additional Equipment:   Intra-op Plan:   Post-operative Plan:   Informed Consent: I have reviewed the patients History and Physical, chart, labs and discussed the procedure including the risks, benefits and alternatives for the proposed anesthesia with the patient or authorized representative who has indicated his/her understanding and acceptance.     Dental advisory given  Plan Discussed with: CRNA and Anesthesiologist  Anesthesia Plan  Comments:         Anesthesia Quick Evaluation

## 2021-01-11 NOTE — Transfer of Care (Signed)
Immediate Anesthesia Transfer of Care Note  Patient: Noah [name]  Procedure(s) Performed: COLONOSCOPY WITH PROPOFOL  Patient Location: PACU  Anesthesia Type:General  Level of Consciousness: awake, alert  and oriented  Airway & Oxygen Therapy: Patient Spontanous Breathing and Patient connected to nasal cannula oxygen  Post-op Assessment: Report given to RN and Post -op Vital signs reviewed and stable  Post vital signs: Reviewed and stable  Last Vitals:  Vitals Value Taken Time  BP    Temp    Pulse    Resp    SpO2      Last Pain:  Vitals:   01/11/21 1000  TempSrc: Temporal  PainSc: 0-No pain         Complications: No notable events documented.

## 2021-01-12 ENCOUNTER — Encounter: Payer: Self-pay | Admitting: Gastroenterology

## 2021-01-12 LAB — SURGICAL PATHOLOGY

## 2021-01-22 IMAGING — DX DG CHEST 2V
2 series · 2 of 2 positions shown · non-contrast
Comparison: 12/01/2018

CLINICAL DATA: Low oxygen saturation

EXAM:
CHEST - 2 VIEW

[chest pa]
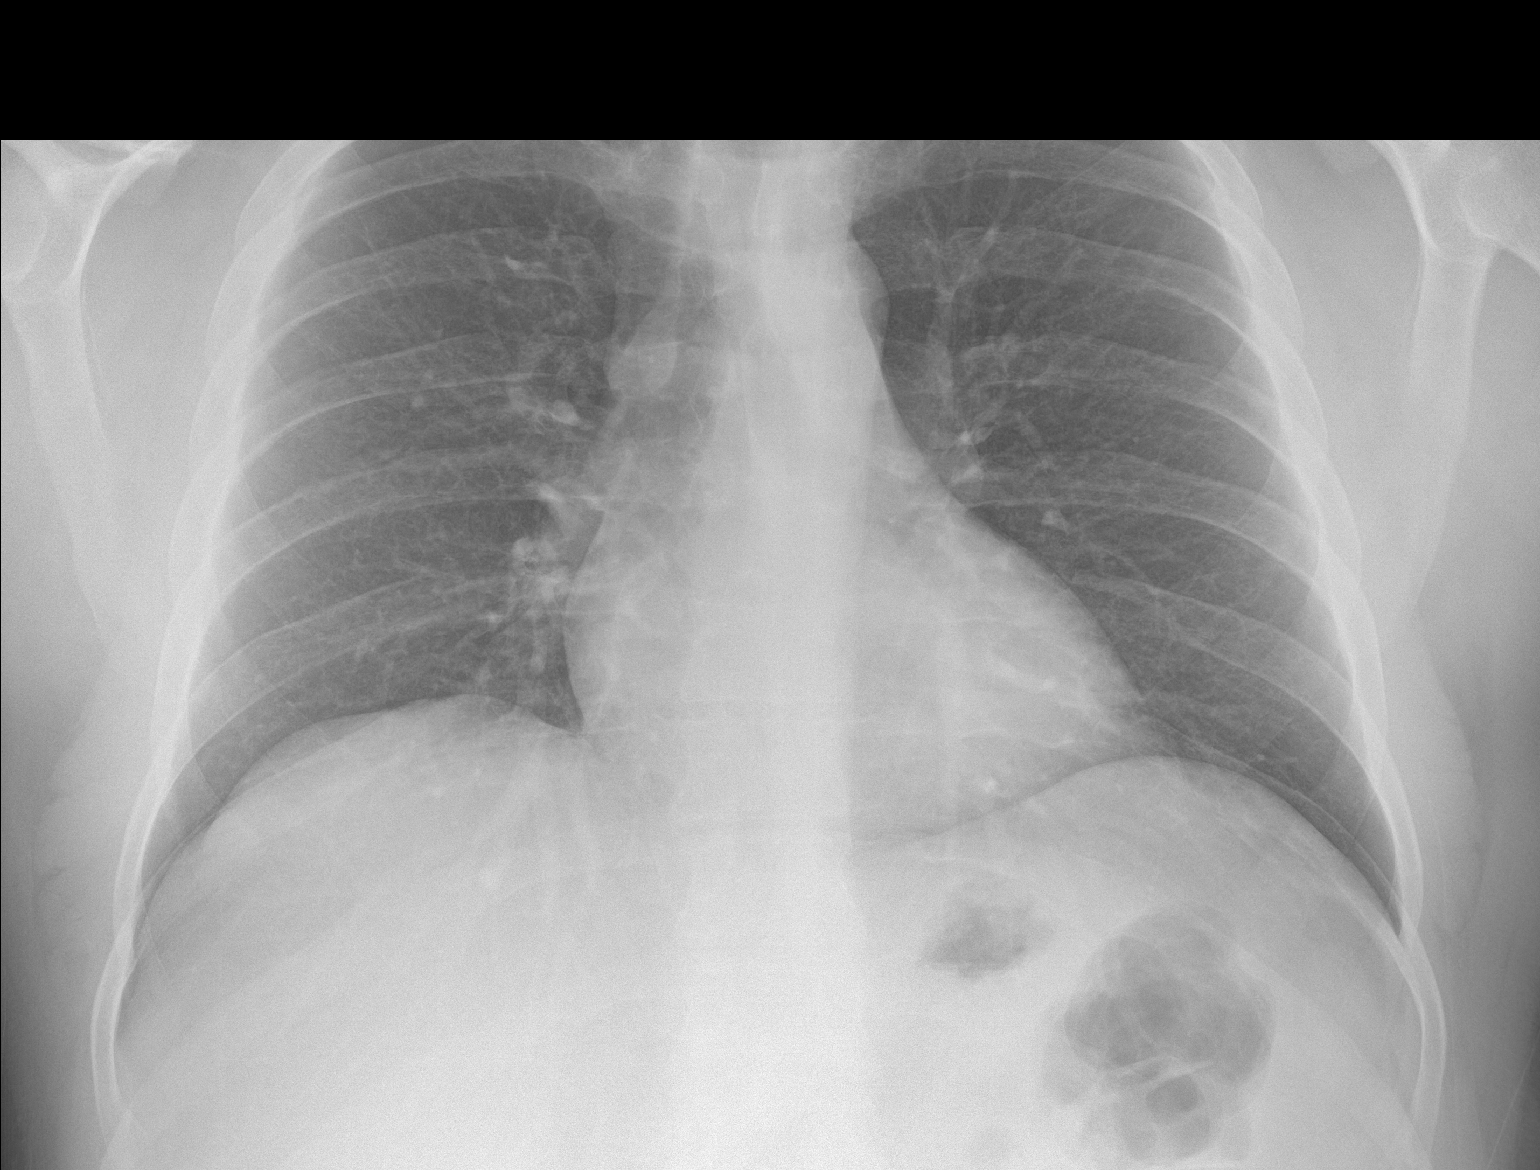

[chest lat]
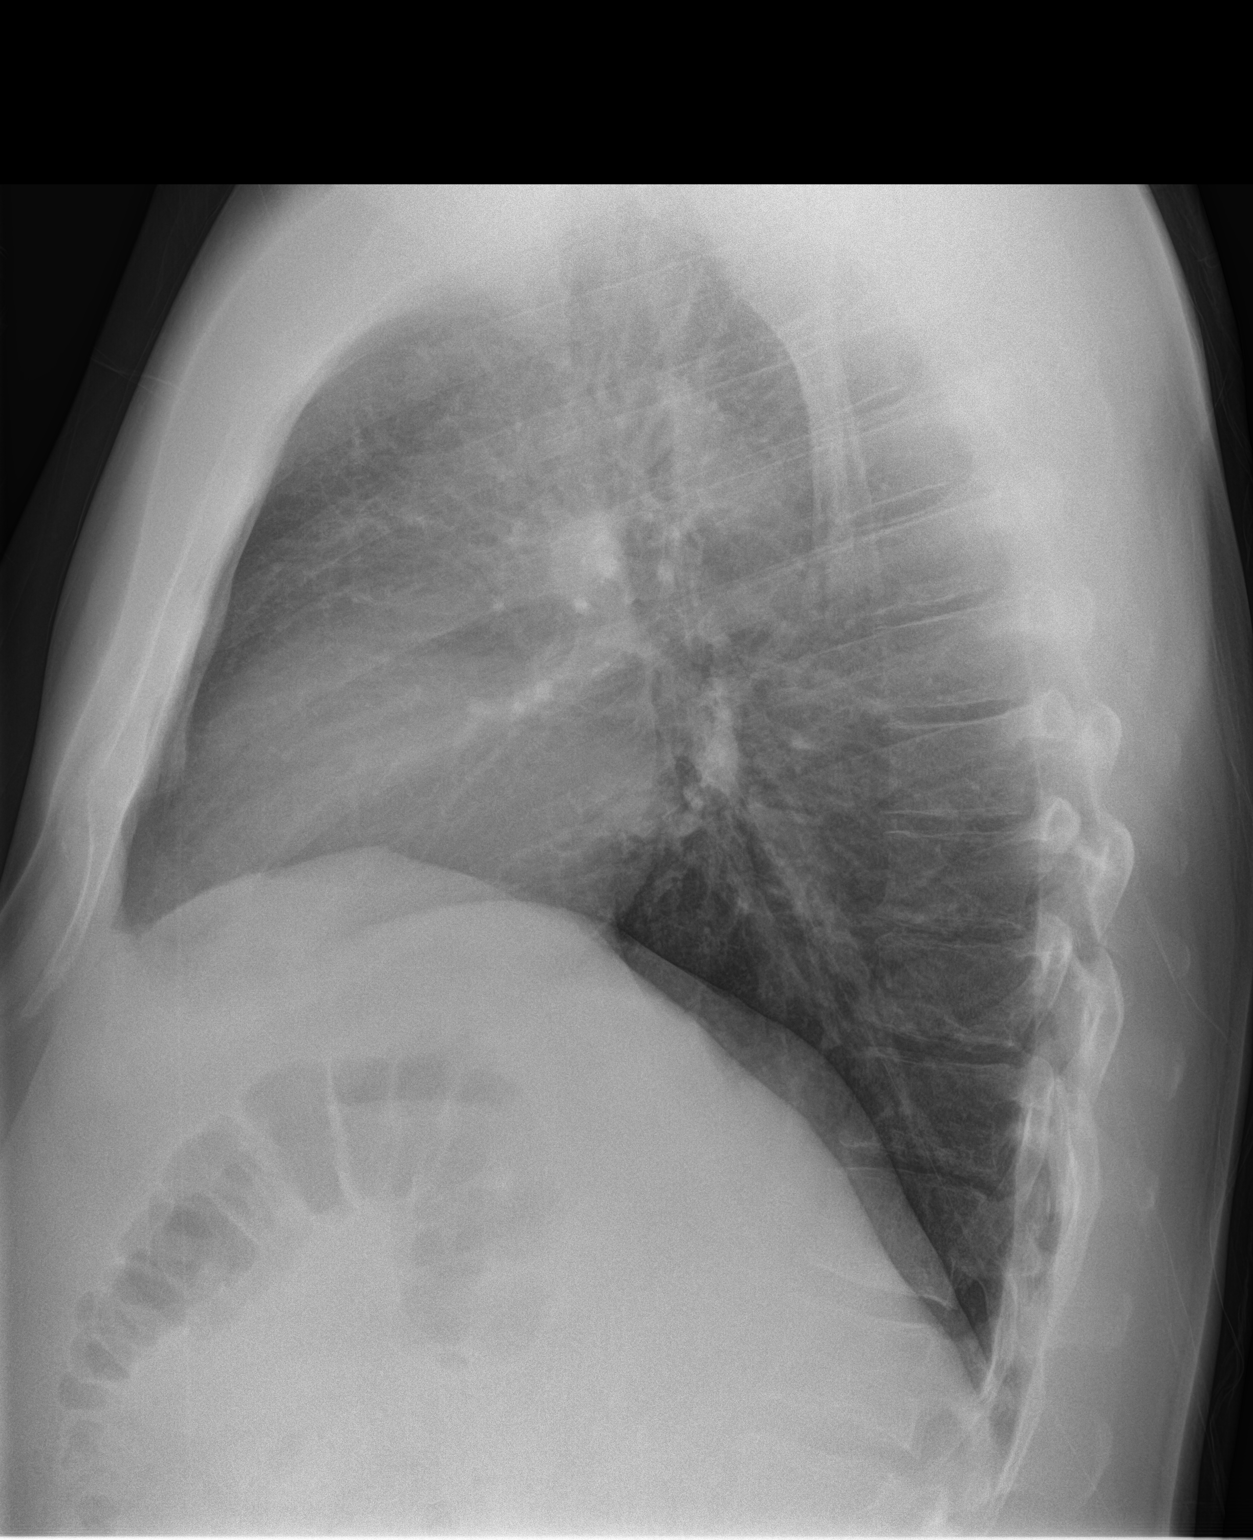

[2 of 2 positions shown; findings below may reference images not displayed]

FINDINGS: The heart size and mediastinal contours are within normal limits.
Both lungs are clear. The visualized skeletal structures are
unremarkable.
IMPRESSION: No active cardiopulmonary disease.

## 2021-02-01 ENCOUNTER — Encounter: Payer: Self-pay | Admitting: Gastroenterology

## 2021-02-01 ENCOUNTER — Other Ambulatory Visit: Payer: Self-pay

## 2021-02-01 ENCOUNTER — Ambulatory Visit: Payer: No Typology Code available for payment source | Admitting: Gastroenterology

## 2021-02-01 DIAGNOSIS — K760 Fatty (change of) liver, not elsewhere classified: Secondary | ICD-10-CM | POA: Diagnosis not present
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
## 2021-02-03 ENCOUNTER — Telehealth: Payer: Self-pay

## 2021-02-03 NOTE — Telephone Encounter (Signed)
-----   Message from Virgel Manifold, MD sent at 02/02/2021  8:53 AM EDT ----- Threasa Beards, I would like this pt to get repeat hepatic function panel done. I have ordered this. Can you please advise him to go to the lab to get this drawn. And make sure he has clinic follow up in 6 months for fatty liver as well. Thank you

## 2021-02-15 ENCOUNTER — Other Ambulatory Visit: Payer: Self-pay

## 2021-02-15 ENCOUNTER — Encounter: Payer: Self-pay | Admitting: Family Medicine

## 2021-02-15 ENCOUNTER — Ambulatory Visit (INDEPENDENT_AMBULATORY_CARE_PROVIDER_SITE_OTHER): Payer: No Typology Code available for payment source | Admitting: Family Medicine

## 2021-02-15 VITALS — BP 104/76 | HR 78 | Temp 97.8°F | Ht 72.0 in | Wt 225.1 lb

## 2021-02-15 DIAGNOSIS — M79671 Pain in right foot: Secondary | ICD-10-CM

## 2021-02-15 NOTE — Progress Notes (Signed)
Noah [name] T. [name], MD, [place] at [facility] [place], [address]  Phone: [phone]  FAX: [place] - 35 y.o. male  MRN [id number]  Date of Birth: 1985-10-30  Date: 02/15/2021  PCP: Abner [place]pan, MD  Referral: Abner [place], MD  Chief Complaint  Patient presents with   Foot Pain    Right Heel x 1 month-no injury    This visit occurred during the SARS-CoV-2 public health emergency.  Safety protocols were in place, including screening questions prior to the visit, additional usage of staff PPE, and extensive cleaning of exam room while observing appropriate contact time as indicated for disinfecting solutions.   Subjective:   Noah [name] is a 35 y.o. very pleasant male patient with Body mass index is 30.53 kg/m. who presents with the following:  Noah [name] is a well-known patient, and he presents with a 1 month history of posterior heel pain.  About 2 weeks or so ago he did go on a 15 mile hike and a 0 drop shoe with his family, and after this and the subsequent day his posterior heel felt very bad.  It is intermittently felt quite severe and other times it is really not been all that bad.  Today, he really has virtually no symptoms, but yesterday he was in significant pain and had a hard time doing basic things and some construction work on his house.  He never had any significant bruising or swelling in the region.  He does note that this bothers him even when not weightbearing and with flexion and dorsiflexion at the ankle.  Hiked for about fifteen miles.  All in one day.    He does not describe bone or ligament pain around this region including the tibia, fibula, calcaneus or the entirety of the foot.  No significant prior history.  Review of Systems is noted in the HPI, as appropriate   Objective:   BP 104/76   Pulse 78   Temp 97.8 F (36.6 C) (Temporal)   Ht 6' (1.829 m)    Wt 225 lb 2 oz (102.1 kg)   SpO2 98%   BMI 30.53 kg/m    ANKLE: R Echymosis: no Edema: no ROM: Full dorsi and plantar flexion, inversion, eversion Gait: heel toe, non-antalgic He is able to stand on his tiptoes without any significant pain. Lateral Mall: NT Medial Mall: NT Talus: NT Navicular: NT Cuboid: NT Calcaneous: NT Metatarsals: NT 5th MT: NT Phalanges: NT Achilles: NT Plantar Fascia: NT Fat Pad: NT Peroneals: NT Post Tib: NT Great Toe: Nml motion Ant Drawer: neg Talar Tilt: neg ATFL: NT CFL: NT Deltoid: NT Str: 5/5 Other Special tests: none Sensation: intact   Homans is negative.  Radiology: No results found.  Assessment and Plan:     ICD-10-CM   1. Inflammatory heel pain, right  M79.671 Ambulatory referral to Physical Therapy     Somewhat difficult and that his exam does not yield pain today.  He does not have any focal swelling at the Achilles, and he has nontender throughout all bony anatomy including all ligamentous structures.  Nontender at the retrocalcaneal bursa.  With the intermittent history of sometimes doing well and sometimes having rapid recurrence of pain, this could go along with some small horizontal or interstitial tear with incomplete scar formation and subsequent pain with breaking of scar tissue and really tearing.  Overuse Achilles tendinopathy is certainly in  the differential as well, though this has no focal pain and no enlargement of the tendon.  I would like for him to wear a cam walker boot for the next 2 weeks, then transition out of it and begin physical therapy.  The majority of these cases ultimately do well.  Follow-up: Return in about 5 weeks (around 03/22/2021).  Dragon [employer] One speech-to-text software was used for transcription in this dictation.  Possible transcriptional errors can occur using Editor, commissioning.   Signed,  Maud Deed. Shante Maysonet, MD   Outpatient Encounter Medications as of 02/15/2021   Medication Sig   albuterol (PROAIR HFA) 108 (90 Base) MCG/ACT inhaler Inhale 1-2 puffs into the lungs every 6 (six) hours as needed for wheezing or shortness of breath.   levothyroxine (SYNTHROID) 125 MCG tablet TAKE 1 TABLET BY MOUTH DAILY BEFORE BREAKFAST.   vitamin B-12 (CYANOCOBALAMIN) 1000 MCG tablet Take 1,000 mcg by mouth daily.   [DISCONTINUED] montelukast (SINGULAIR) 10 MG tablet Take 10 mg by mouth at bedtime.   No facility-administered encounter medications on file as of 02/15/2021.

## 2021-03-01 ENCOUNTER — Ambulatory Visit: Payer: 59 | Attending: Family Medicine

## 2021-03-01 DIAGNOSIS — R262 Difficulty in walking, not elsewhere classified: Secondary | ICD-10-CM | POA: Diagnosis present

## 2021-03-01 DIAGNOSIS — M79671 Pain in right foot: Secondary | ICD-10-CM | POA: Insufficient documentation
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
## 2021-03-01 NOTE — Patient Instructions (Signed)
Access Code: [email or web address] URL: [email or web address] Date: 03/01/2021 Prepared by: Joneen Boers  Exercises Seated Isometric Ankle Plantarflexion - 3 x daily - 7 x weekly - 1 sets - 5 reps - 1 minute hold

## 2021-03-07 ENCOUNTER — Ambulatory Visit: Payer: 59

## 2021-03-07 DIAGNOSIS — M79671 Pain in right foot: Secondary | ICD-10-CM

## 2021-03-07 DIAGNOSIS — R262 Difficulty in walking, not elsewhere classified: Secondary | ICD-10-CM
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
## 2021-03-13 ENCOUNTER — Ambulatory Visit: Payer: 59

## 2021-03-13 DIAGNOSIS — R262 Difficulty in walking, not elsewhere classified: Secondary | ICD-10-CM

## 2021-03-13 DIAGNOSIS — M79671 Pain in right foot: Secondary | ICD-10-CM
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
## 2021-03-15 ENCOUNTER — Other Ambulatory Visit: Payer: Self-pay

## 2021-03-15 ENCOUNTER — Ambulatory Visit: Payer: 59

## 2021-03-15 DIAGNOSIS — M79671 Pain in right foot: Secondary | ICD-10-CM

## 2021-03-15 DIAGNOSIS — R262 Difficulty in walking, not elsewhere classified: Secondary | ICD-10-CM

## 2021-03-15 NOTE — Patient Instructions (Signed)
Access Code: [email or web address] URL: [email or web address] Date: 03/15/2021 Prepared by: Joneen Boers  Exercises Seated Isometric Ankle Plantarflexion - 3 x daily - 7 x weekly - 1 sets - 5 reps - 1 minute hold Seated Ankle Plantar Flexion with Resistance Loop - 3 x daily - 7 x weekly - 1 sets - 10 reps Sidelying Hip Abduction - 1 x daily - 7 x weekly - 3 sets - 10 reps Prone Hip Extension with Bent Knee - 1 x daily - 7 x weekly - 3 sets - 10 reps
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
## 2021-03-20 ENCOUNTER — Ambulatory Visit: Payer: No Typology Code available for payment source | Attending: Family Medicine

## 2021-03-20 DIAGNOSIS — R262 Difficulty in walking, not elsewhere classified: Secondary | ICD-10-CM | POA: Insufficient documentation

## 2021-03-20 DIAGNOSIS — M79671 Pain in right foot: Secondary | ICD-10-CM | POA: Diagnosis present
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
## 2021-03-22 ENCOUNTER — Ambulatory Visit: Payer: No Typology Code available for payment source | Admitting: Family Medicine

## 2021-03-22 NOTE — Progress Notes (Deleted)
    Noah Moon T. Noah Wicke, Noah [name], Noah [name], Noah [name]  Phone: [phone]  FAX: [place] - 35 y.o. male  MRN [id number]  Date of Birth: [birth year]  Date: 03/22/2021  PCP: Abner Greenspan, Noah [name]  Referral: Abner Greenspan, Noah [name]  No chief complaint on file.   This visit occurred during the SARS-CoV-2 public health emergency.  Safety protocols were in place, including screening questions prior to the visit, additional usage of staff PPE, and extensive cleaning of exam room while observing appropriate contact time as indicated for disinfecting solutions.   Subjective:   Noah [name] is a 35 y.o. very pleasant male patient with There is no height or weight on file to calculate BMI. who presents with the following:  [name] is here to follow-up about his right heel pain.  Last time I saw him, and his exam he was fairly asymptomatic, but he had a history of having things be at times relatively bad, and other times it would feel okay.  I did have him start some physical therapy, and their notes indicate that he has been improving.    Review of Systems is noted in the HPI, as appropriate  Objective:   There were no vitals taken for this visit.  GEN: No acute distress; alert,appropriate. PULM: Breathing comfortably in no respiratory distress PSYCH: Normally interactive.   Laboratory and Imaging Data:  Assessment and Plan:   ***

## 2021-03-27 ENCOUNTER — Other Ambulatory Visit: Payer: Self-pay

## 2021-03-27 ENCOUNTER — Ambulatory Visit: Payer: No Typology Code available for payment source

## 2021-03-27 ENCOUNTER — Ambulatory Visit (INDEPENDENT_AMBULATORY_CARE_PROVIDER_SITE_OTHER): Payer: No Typology Code available for payment source | Admitting: Family Medicine

## 2021-03-27 ENCOUNTER — Encounter: Payer: Self-pay | Admitting: Family Medicine

## 2021-03-27 VITALS — BP 100/70 | HR 82 | Temp 98.3°F | Ht 72.0 in | Wt 200.2 lb

## 2021-03-27 DIAGNOSIS — M7661 Achilles tendinitis, right leg: Secondary | ICD-10-CM

## 2021-03-27 DIAGNOSIS — R262 Difficulty in walking, not elsewhere classified: Secondary | ICD-10-CM

## 2021-03-27 DIAGNOSIS — M79671 Pain in right foot: Secondary | ICD-10-CM

## 2021-03-27 MED ORDER — NITROGLYCERIN 0.2 MG/HR TD PT24: MEDICATED_PATCH | TRANSDERMAL | 2 refills | Status: DC

## 2021-03-27 NOTE — Patient Instructions (Addendum)
[employer] heel lifts -  go to [email or web address] - prob 3 x 5/6  Nitroglycerin Protocol  Apply 1/4 nitroglycerin patch to affected area daily. Change position of patch within the affected area every 24 hours. You may experience a headache during the first 1-2 weeks of using the patch, these should subside. If you experience headaches after beginning nitroglycerin patch treatment, you may take your preferred over the counter pain reliever. Another side effect of the nitroglycerin patch is skin irritation or rash related to patch adhesive. Please notify our office if you develop more severe headaches or rash, and stop the patch. Tendon healing with nitroglycerin patch may require 12 to 24 weeks depending on the extent of injury. Men should not use if taking Viagra, Cialis, or Levitra.  Do not use if you have migraines or rosacea.

## 2021-03-27 NOTE — Progress Notes (Signed)
Noah [name] T. Noah Stetson, MD, [place] [facility] [place], [address]  Phone: [phone]  FAX: Noah [name] - 35 y.o. male  MRN [id number]  Date of Birth: [birth year]  Date: 03/27/2021  PCP: Noah Greenspan, MD  Referral: Noah Greenspan, MD  Chief Complaint  Patient presents with   Follow-up    Right Heel Pain    This visit occurred during the SARS-CoV-2 public health emergency.  Safety protocols were in place, including screening questions prior to the visit, additional usage of staff PPE, and extensive cleaning of exam room while observing appropriate contact time as indicated for disinfecting solutions.   Subjective:   Noah [name] is a 35 y.o. very pleasant male patient with Body mass index is 27.16 kg/m. who presents with the following:  Noah [name] presents here in follow-up with his right-sided heel pain.  He has been going to physical therapy, and is here for recheck.  I reviewed the PT notes, and the and they feel as if he is making some good progress.  There are times where he is completely asymptomatic, and there are other times where his heel does hurt.  At this point it is isolated to the heel and the Achilles tendon at its insertion as well as just proximal of this.  This point he does not have a enlargement at the insertion.  Today, it is not really hurting all that bad.  He has been compliant with doing his rehab including doing some eccentric's.  Review of Systems is noted in the HPI, as appropriate  Objective:   BP 100/70   Pulse 82   Temp 98.3 F (36.8 C) (Temporal)   Ht 6' (1.829 m)   Wt 200 lb 4 oz (90.8 kg)   SpO2 97%   BMI 27.16 kg/m   GEN: No acute distress; alert,appropriate. PULM: Breathing comfortably in no respiratory distress PSYCH: Normally interactive.   Full range of motion at the right foot and ankle.  The entirety of the forefoot, midfoot, and hindfoot  is nontender.  The entirety of the tibia and fibula are nontender including both malleoli.  Nontender with calcaneal squeeze.  Does have some mild tenderness at the Achilles just proximal to the insertion and to a modest degree at the insertion.  Laboratory and Imaging Data:  Assessment and Plan:     ICD-10-CM   1. Right Achilles tendinitis  M76.61      He has making some improvement, but he is frustrated with his progress.  He is basically asymptomatic today.  He has had days where he has been good and others where he has had some flareup.  He is also continued to be quite active doing work.  I am going to place him in a heel lift and also do a nitroglycerin protocol.  I went over eccentric's and their importance with him again.  I think at this point he can follow-up only as needed.  Meds ordered this encounter  Medications   nitroGLYCERIN (NITRODUR - DOSED IN MG/24 HR) 0.2 mg/hr patch    Sig: Apply 1/4 patch to affected area as directed by MD and change every 24 hours.    Dispense:  30 patch    Refill:  2   Signed,  Noah [name] T. Vaneza Pickart, MD   Outpatient Encounter Medications as of 03/27/2021  Medication Sig   albuterol (PROAIR HFA) 108 (90 Base) MCG/ACT inhaler Inhale 1-2  puffs into the lungs every 6 (six) hours as needed for wheezing or shortness of breath.   levothyroxine (SYNTHROID) 125 MCG tablet TAKE 1 TABLET BY MOUTH DAILY BEFORE BREAKFAST.   nitroGLYCERIN (NITRODUR - DOSED IN MG/24 HR) 0.2 mg/hr patch Apply 1/4 patch to affected area as directed by MD and change every 24 hours.   vitamin B-12 (CYANOCOBALAMIN) 1000 MCG tablet Take 1,000 mcg by mouth daily.   No facility-administered encounter medications on file as of 03/27/2021.
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
## 2021-04-03 ENCOUNTER — Ambulatory Visit: Payer: No Typology Code available for payment source

## 2021-04-03 DIAGNOSIS — R262 Difficulty in walking, not elsewhere classified: Secondary | ICD-10-CM

## 2021-04-03 DIAGNOSIS — M79671 Pain in right foot: Secondary | ICD-10-CM
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
## 2021-04-03 NOTE — Patient Instructions (Signed)
Seated R ankle PF isometrics 40% effort  5 x 1 minute          self resistance using strap  Reviewed and given as part of his HEP. Pt demonstrated and verbalized understanding. Handout provided.

## 2021-05-03 DIAGNOSIS — G4733 Obstructive sleep apnea (adult) (pediatric): Secondary | ICD-10-CM | POA: Diagnosis not present

## 2021-05-06 ENCOUNTER — Other Ambulatory Visit: Payer: Self-pay | Admitting: Family Medicine

## 2021-05-29 ENCOUNTER — Encounter: Payer: Self-pay | Admitting: Family Medicine

## 2021-05-29 ENCOUNTER — Other Ambulatory Visit: Payer: Self-pay

## 2021-05-29 ENCOUNTER — Ambulatory Visit (INDEPENDENT_AMBULATORY_CARE_PROVIDER_SITE_OTHER): Payer: 59 | Admitting: Family Medicine

## 2021-05-29 VITALS — BP 130/72 | HR 79 | Temp 97.8°F | Ht 72.0 in | Wt 189.6 lb

## 2021-05-29 DIAGNOSIS — M7661 Achilles tendinitis, right leg: Secondary | ICD-10-CM | POA: Diagnosis not present

## 2021-05-29 DIAGNOSIS — M25671 Stiffness of right ankle, not elsewhere classified: Secondary | ICD-10-CM

## 2021-05-29 NOTE — Progress Notes (Signed)
Edgardo Petrenko T. Jone Panebianco, MD, [place] [facility] [place], [address]  Phone: [phone]   FAX: [place] - 36 y.o. male   MRN [id number]   Date of Birth: 07/20/1985  Date: 05/29/2021   PCP: Abner Greenspan, MD   Referral: Abner Greenspan, MD  Chief Complaint  Patient presents with   Follow-up    Here for 2 mo f/u     This visit occurred during the SARS-CoV-2 public health emergency.  Safety protocols were in place, including screening questions prior to the visit, additional usage of staff PPE, and extensive cleaning of exam room while observing appropriate contact time as indicated for disinfecting solutions.   Subjective:   Noah [name] is a 36 y.o. very pleasant male patient with Body mass index is 25.71 kg/m. who presents with the following:  Noah [name] is here to follow-up:  He has had some persistent R sided heel pain for several months.  This is focal at the Achilles tendon, and he was having some insertional pain, but now he is having pain just proximal to the Achilles.  Has not had any additional injury at all.  This will wax and wane, sometimes it does not hurt that bad, but he does have a deep dull ache at times.  He does think that the heel lifts have helped somewhat.  He did do the nitroglycerin patches that I gave him only 1 week.  He does think that icing will help some.  He works on his feet all day.  He does have some 0 drop VANS type shoes that he wears all of this time.  He was going to physical therapy, but he stopped doing his home rehab.  He thought that this might be making it hurt more.  Is still having pain proximal to achilles.  Has not been working out at all.  He has gotten somewhat deconditioned, and he is lost some motion at the lower extremity.  Contracture with loss of motion on the R    Write out achilles ROM exercises.   Review of Systems is noted in the  HPI, as appropriate   Objective:   BP 130/72    Pulse 79    Temp 97.8 F (36.6 C) (Temporal)    Ht 6' (1.829 m)    Wt 189 lb 9 oz (86 kg)    SpO2 97%    BMI 25.71 kg/m   GEN: No acute distress; alert,appropriate. PULM: Breathing comfortably in no respiratory distress PSYCH: Normally interactive.    Nontender throughout the forefoot, midfoot, hindfoot, ankle, and all ligamentous structures.  Nontender at the Achilles insertion.  Just proximal to the Achilles insertion there is some tenderness without a nodule.  Strength is intact throughout.  On exam, he has notable loss of motion compared to the contralateral side.  I had the patient show motion while standing with a straight leg as well as a bent leg, and there is fairly significant difference compared to the contralateral side.  Radiology: No results found.  Assessment and Plan:     ICD-10-CM   1. Right Achilles tendinitis  M76.61     2. Decreased range of motion of right ankle  M25.671      Ongoing Achilles tendinopathy.  This is better compared to my initial evaluation, and has changed in his position somewhat.  I do think that his footwear and movement patterns have  likely caused some delay.  Unfortunately, he has developed a contracture, and his range of motion is worse in the lower extremity.  This usually takes some time to return to range of motion of the contralateral side.  I reviewed with him how to work on this, and he really needs to do it every day.  Also simplifying his rehab program, but I do want him to do this every day.  Also think it is very important for him to get an better physical shape, and I encouraged him to start off very slowly and return to activity.  Patient Instructions  Nitroglycerin Protocol  Apply 1/4 nitroglycerin patch to affected area daily. Change position of patch within the affected area every 24 hours. You may experience a headache during the first 1-2 weeks of using the patch, these  should subside.Tendon healing with nitroglycerin patch may require 12 to 24 weeks depending on the extent of injury.  Start to walk 10 minutes a day  Your achilles tendon and calf have developed a contracture, and your range of motion at the ankle needs to be the same in both sides.  - Do not be overly aggressive with the stretching.  Get a soft, gently stretch and hold for 15 seconds.   It is ok to get higher heel lifts.    No orders of the defined types were placed in this encounter.  There are no discontinued medications. No orders of the defined types were placed in this encounter.   Follow-up: Return in about 2 months (around 07/27/2021).  Dragon [employer] One speech-to-text software was used for transcription in this dictation.  Possible transcriptional errors can occur using Editor, commissioning.   Signed,  Maud Deed. Calise Dunckel, MD   Outpatient Encounter Medications as of 05/29/2021  Medication Sig   albuterol (PROAIR HFA) 108 (90 Base) MCG/ACT inhaler Inhale 1-2 puffs into the lungs every 6 (six) hours as needed for wheezing or shortness of breath.   levothyroxine (SYNTHROID) 125 MCG tablet TAKE 1 TABLET BY MOUTH EVERY DAY BEFORE BREAKFAST   nitroGLYCERIN (NITRODUR - DOSED IN MG/24 HR) 0.2 mg/hr patch Apply 1/4 patch to affected area as directed by MD and change every 24 hours.   vitamin B-12 (CYANOCOBALAMIN) 1000 MCG tablet Take 1,000 mcg by mouth daily.   No facility-administered encounter medications on file as of 05/29/2021.

## 2021-05-29 NOTE — Patient Instructions (Signed)
Nitroglycerin Protocol  Apply 1/4 nitroglycerin patch to affected area daily. Change position of patch within the affected area every 24 hours. You may experience a headache during the first 1-2 weeks of using the patch, these should subside.Tendon healing with nitroglycerin patch may require 12 to 24 weeks depending on the extent of injury.  Start to walk 10 minutes a day  Your achilles tendon and calf have developed a contracture, and your range of motion at the ankle needs to be the same in both sides.  - Do not be overly aggressive with the stretching.  Get a soft, gently stretch and hold for 15 seconds.   It is ok to get higher heel lifts.

## 2021-05-30 ENCOUNTER — Encounter: Payer: Self-pay | Admitting: Family Medicine

## 2021-06-02 DIAGNOSIS — G4733 Obstructive sleep apnea (adult) (pediatric): Secondary | ICD-10-CM | POA: Diagnosis not present

## 2021-07-27 ENCOUNTER — Encounter: Payer: Self-pay | Admitting: Family Medicine

## 2021-07-27 ENCOUNTER — Ambulatory Visit (INDEPENDENT_AMBULATORY_CARE_PROVIDER_SITE_OTHER): Payer: 59 | Admitting: Family Medicine

## 2021-07-27 VITALS — BP 118/78 | HR 75 | Temp 98.0°F | Ht 72.0 in | Wt 194.5 lb

## 2021-07-27 DIAGNOSIS — G4733 Obstructive sleep apnea (adult) (pediatric): Secondary | ICD-10-CM

## 2021-07-27 DIAGNOSIS — K760 Fatty (change of) liver, not elsewhere classified: Secondary | ICD-10-CM

## 2021-07-27 DIAGNOSIS — R1010 Upper abdominal pain, unspecified: Secondary | ICD-10-CM | POA: Diagnosis not present

## 2021-07-27 DIAGNOSIS — E039 Hypothyroidism, unspecified: Secondary | ICD-10-CM

## 2021-07-27 DIAGNOSIS — Z13220 Encounter for screening for lipoid disorders: Secondary | ICD-10-CM

## 2021-07-27 DIAGNOSIS — Z9989 Dependence on other enabling machines and devices: Secondary | ICD-10-CM

## 2021-07-27 DIAGNOSIS — R7401 Elevation of levels of liver transaminase levels: Secondary | ICD-10-CM

## 2021-07-27 DIAGNOSIS — E538 Deficiency of other specified B group vitamins: Secondary | ICD-10-CM | POA: Diagnosis not present

## 2021-07-27 DIAGNOSIS — K635 Polyp of colon: Secondary | ICD-10-CM

## 2021-07-27 LAB — VITAMIN B12: Vitamin B-12: 1026 pg/mL — ABNORMAL HIGH (ref 211–911)

## 2021-07-27 LAB — HEPATIC FUNCTION PANEL
ALT: 16 U/L (ref 0–53)
AST: 16 U/L (ref 0–37)
Albumin: 4.4 g/dL (ref 3.5–5.2)
Alkaline Phosphatase: 62 U/L (ref 39–117)
Bilirubin, Direct: 0.1 mg/dL (ref 0.0–0.3)
Total Bilirubin: 0.6 mg/dL (ref 0.2–1.2)
Total Protein: 6.3 g/dL (ref 6.0–8.3)

## 2021-07-27 LAB — BASIC METABOLIC PANEL
BUN: 20 mg/dL (ref 6–23)
CO2: 27 mEq/L (ref 19–32)
Calcium: 9.3 mg/dL (ref 8.4–10.5)
Chloride: 105 mEq/L (ref 96–112)
Creatinine, Ser: 0.76 mg/dL (ref 0.40–1.50)
GFR: 115 mL/min (ref >60.00)
Glucose, Bld: 99 mg/dL (ref 70–99)
Potassium: 4 mEq/L (ref 3.5–5.1)
Sodium: 140 mEq/L (ref 135–145)

## 2021-07-27 LAB — LIPID PANEL
Cholesterol: 173 mg/dL (ref 0–200)
HDL: 50.1 mg/dL (ref >39.00)
LDL Cholesterol: 105 mg/dL — ABNORMAL HIGH (ref 0–99)
NonHDL: 122.43
Total CHOL/HDL Ratio: 3
Triglycerides: 85 mg/dL (ref 0.0–149.0)
VLDL: 17 mg/dL (ref 0.0–40.0)

## 2021-07-27 LAB — TSH: TSH: 1.04 u[IU]/mL (ref 0.35–5.50)

## 2021-07-27 NOTE — Assessment & Plan Note (Addendum)
Lipids ordered with labs today  ?Diet is much improved  ? ?

## 2021-07-27 NOTE — Assessment & Plan Note (Signed)
Polyps on colonoscopy 2022 ?Recall is 3 y  ?

## 2021-07-27 NOTE — Patient Instructions (Signed)
Labs today  ? ?Keep up the good work with healthy habits  ?Try and maintain the weight loss  ?Drink lots of water  ? ?Glad you are doing better! ?

## 2021-07-27 NOTE — Assessment & Plan Note (Signed)
Lost 40 lb and doing better ?Still requires cpap and uses it regularly  ?

## 2021-07-27 NOTE — Assessment & Plan Note (Signed)
Seen on [place]  ?Rev GI notes  ?Lost 40 lb and no etoh for 3 mo -commended ?Now one beer per day  ? ?Lab today  ?
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
## 2021-07-27 NOTE — Assessment & Plan Note (Signed)
More energy lately after intentional 40 lb wt loss ? ?TSH today  ?Taking levothyroxine 125 mcg daily ?

## 2021-07-27 NOTE — Assessment & Plan Note (Signed)
Resolved with better diet ?

## 2021-07-27 NOTE — Assessment & Plan Note (Signed)
Lab Results  ?Component Value Date  ? [id number] 1,331 (H) 07/22/2020  ? ?Lab today  ?Takes 1000 mcg daily  ?Diet is much better  ?

## 2021-07-27 NOTE — Assessment & Plan Note (Signed)
Due to fatty liver  ?Has lost 40 lb intentionally with diet  ?No etoh for 3 mo , now avg beer a day  ? ?Re check today ?Expect imp  ?No abd symptoms  ?

## 2021-07-28 ENCOUNTER — Encounter: Payer: Self-pay | Admitting: *Deleted

## 2021-08-31 DIAGNOSIS — G4733 Obstructive sleep apnea (adult) (pediatric): Secondary | ICD-10-CM | POA: Diagnosis not present

## 2021-10-26 DIAGNOSIS — T1502XA Foreign body in cornea, left eye, initial encounter: Secondary | ICD-10-CM | POA: Diagnosis not present

## 2021-11-01 ENCOUNTER — Ambulatory Visit (INDEPENDENT_AMBULATORY_CARE_PROVIDER_SITE_OTHER): Payer: 59

## 2021-11-01 ENCOUNTER — Encounter: Payer: Self-pay | Admitting: Podiatry

## 2021-11-01 ENCOUNTER — Ambulatory Visit: Payer: 59 | Admitting: Podiatry

## 2021-11-01 DIAGNOSIS — M7661 Achilles tendinitis, right leg: Secondary | ICD-10-CM

## 2021-11-01 DIAGNOSIS — M778 Other enthesopathies, not elsewhere classified: Secondary | ICD-10-CM | POA: Diagnosis not present

## 2021-11-01 DIAGNOSIS — G5781 Other specified mononeuropathies of right lower limb: Secondary | ICD-10-CM | POA: Diagnosis not present

## 2021-11-01 MED ORDER — TRIAMCINOLONE ACETONIDE 40 MG/ML IJ SUSP: 20.0000 mg | Freq: Once | INTRAMUSCULAR | Status: DC
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
## 2021-11-29 DIAGNOSIS — G4733 Obstructive sleep apnea (adult) (pediatric): Secondary | ICD-10-CM | POA: Diagnosis not present

## 2021-12-01 ENCOUNTER — Other Ambulatory Visit: Payer: Self-pay | Admitting: Family Medicine

## 2021-12-06 ENCOUNTER — Ambulatory Visit: Payer: 59 | Admitting: Podiatry

## 2021-12-14 ENCOUNTER — Ambulatory Visit: Payer: 59 | Admitting: Podiatry

## 2021-12-14 ENCOUNTER — Encounter: Payer: Self-pay | Admitting: Podiatry

## 2021-12-14 DIAGNOSIS — G5781 Other specified mononeuropathies of right lower limb: Secondary | ICD-10-CM | POA: Diagnosis not present

## 2021-12-14 MED ORDER — TRIAMCINOLONE ACETONIDE 40 MG/ML IJ SUSP
20.0000 mg | Freq: Once | INTRAMUSCULAR | Status: AC
  Administered 2021-12-14: 20 mg

## 2021-12-14 NOTE — Progress Notes (Signed)
He presents today for follow-up of his neuroma third interspace of the right foot.  States that it still hurts and that between the third and fourth toes but I would say is about 50 to 60% improved.  Objective: Vital signs stable he is alert oriented x3 palpable Mulder's click third interdigital space right foot.  Assessment: Resolving neuroma third interdigital space 50 to 60%.  Plan: Injected 10 mg of Kenalog and local anesthetic to the third interspace discussed the need for dehydrated alcohol.  Follow-up with him in 6 weeks

## 2022-01-24 ENCOUNTER — Ambulatory Visit: Payer: 59 | Admitting: Podiatry

## 2022-01-24 ENCOUNTER — Encounter: Payer: Self-pay | Admitting: Podiatry

## 2022-01-24 DIAGNOSIS — G5781 Other specified mononeuropathies of right lower limb: Secondary | ICD-10-CM | POA: Diagnosis not present

## 2022-01-24 NOTE — Progress Notes (Signed)
Noah [name] presents today for follow-up of his neuroma third interdigital space of his right foot.  States that is greater than 60% improved at this point.  States that it comes and goes as far as the pain is concerned.  He states that he wears wider shoes most of the time which really helps control the pain.  He states for the most part he can tolerate this and live with it.  Objective: Vital signs are stable he is alert and oriented x3.  There is no erythema edema cellulitis drainage or odor is palpable Mulder's click third interspace of the right foot with mild tenderness.  Assessment: Neuroma resolved greater than 60%.  Plan: Offered him dehydrated alcohol injection he declined states that he will notify us if he has any more questions or concerns and will also consider orthotics.

## 2022-04-09 ENCOUNTER — Other Ambulatory Visit: Payer: Self-pay | Admitting: Family Medicine

## 2022-04-19 ENCOUNTER — Telehealth: Payer: Self-pay | Admitting: Family Medicine

## 2022-04-19 NOTE — Telephone Encounter (Signed)
We have already been refilling pt's Rx for 90 days supplies. Pt has refills on file for 90 day Rx of levothyroxine

## 2022-04-19 NOTE — Telephone Encounter (Signed)
Pt called in wants to make PCP aware that his insurance will only cover his medication if its in a 90 day supply . Will need new prescription going forward .

## 2022-07-29 ENCOUNTER — Other Ambulatory Visit: Payer: Self-pay | Admitting: Family Medicine

## 2022-07-30 ENCOUNTER — Other Ambulatory Visit: Payer: Self-pay | Admitting: Family Medicine

## 2022-07-30 NOTE — Telephone Encounter (Signed)
Pt needs labs and a CPE or at least a f/u but either way fasting labs prior, please schedule and then route back to me

## 2022-07-30 NOTE — Telephone Encounter (Signed)
Patient scheduled.

## 2022-08-02 ENCOUNTER — Telehealth: Payer: Self-pay | Admitting: Family Medicine

## 2022-08-02 DIAGNOSIS — K760 Fatty (change of) liver, not elsewhere classified: Secondary | ICD-10-CM

## 2022-08-02 DIAGNOSIS — E538 Deficiency of other specified B group vitamins: Secondary | ICD-10-CM

## 2022-08-02 DIAGNOSIS — Z13220 Encounter for screening for lipoid disorders: Secondary | ICD-10-CM

## 2022-08-02 DIAGNOSIS — Z0000 Encounter for general adult medical examination without abnormal findings: Secondary | ICD-10-CM

## 2022-08-02 DIAGNOSIS — R7401 Elevation of levels of liver transaminase levels: Secondary | ICD-10-CM

## 2022-08-02 DIAGNOSIS — E039 Hypothyroidism, unspecified: Secondary | ICD-10-CM

## 2022-08-02 NOTE — Telephone Encounter (Signed)
-----   Message from Alvina Chou sent at 07/31/2022 11:18 AM EDT ----- Regarding: Lab orders for Friday, 4.19.24 Patient is scheduled for CPX labs, please order future labs, Thanks , Camelia Eng

## 2022-08-03 ENCOUNTER — Other Ambulatory Visit (INDEPENDENT_AMBULATORY_CARE_PROVIDER_SITE_OTHER): Payer: No Typology Code available for payment source

## 2022-08-03 DIAGNOSIS — R7401 Elevation of levels of liver transaminase levels: Secondary | ICD-10-CM

## 2022-08-03 DIAGNOSIS — E039 Hypothyroidism, unspecified: Secondary | ICD-10-CM

## 2022-08-03 DIAGNOSIS — K760 Fatty (change of) liver, not elsewhere classified: Secondary | ICD-10-CM

## 2022-08-03 DIAGNOSIS — E538 Deficiency of other specified B group vitamins: Secondary | ICD-10-CM

## 2022-08-03 DIAGNOSIS — Z13220 Encounter for screening for lipoid disorders: Secondary | ICD-10-CM

## 2022-08-03 DIAGNOSIS — Z0000 Encounter for general adult medical examination without abnormal findings: Secondary | ICD-10-CM

## 2022-08-03 NOTE — Addendum Note (Signed)
Addended by: Eual Fines on: 08/03/2022 03:47 PM   Modules accepted: Orders

## 2022-08-04 LAB — CBC WITH DIFFERENTIAL/PLATELET
Absolute Monocytes: 705 cells/uL (ref 200–950)
Basophils Absolute: 77 cells/uL (ref 0–200)
Basophils Relative: 0.9 %
Eosinophils Absolute: 344 cells/uL (ref 15–500)
Eosinophils Relative: 4 %
HCT: 41.6 % (ref 38.5–50.0)
Hemoglobin: 14.3 g/dL (ref 13.2–17.1)
Lymphs Abs: 2855 cells/uL (ref 850–3900)
MCH: 31.5 pg (ref 27.0–33.0)
MCHC: 34.4 g/dL (ref 32.0–36.0)
MCV: 91.6 fL (ref 80.0–100.0)
MPV: 11.9 fL (ref 7.5–12.5)
Monocytes Relative: 8.2 %
Neutro Abs: 4618 cells/uL (ref 1500–7800)
Neutrophils Relative %: 53.7 %
Platelets: 212 10*3/uL (ref 140–400)
RBC: 4.54 10*6/uL (ref 4.20–5.80)
RDW: 13.3 % (ref 11.0–15.0)
Total Lymphocyte: 33.2 %
WBC: 8.6 10*3/uL (ref 3.8–10.8)

## 2022-08-04 LAB — COMPREHENSIVE METABOLIC PANEL
AG Ratio: 2.2 (calc) (ref 1.0–2.5)
ALT: 13 U/L (ref 9–46)
AST: 14 U/L (ref 10–40)
Albumin: 4.7 g/dL (ref 3.6–5.1)
Alkaline phosphatase (APISO): 73 U/L (ref 36–130)
BUN: 16 mg/dL (ref 7–25)
CO2: 24 mmol/L (ref 20–32)
Calcium: 9.7 mg/dL (ref 8.6–10.3)
Chloride: 106 mmol/L (ref 98–110)
Creat: 0.93 mg/dL (ref 0.60–1.26)
Globulin: 2.1 g/dL (calc) (ref 1.9–3.7)
Glucose, Bld: 87 mg/dL (ref 65–99)
Potassium: 4 mmol/L (ref 3.5–5.3)
Sodium: 141 mmol/L (ref 135–146)
Total Bilirubin: 0.7 mg/dL (ref 0.2–1.2)
Total Protein: 6.8 g/dL (ref 6.1–8.1)

## 2022-08-04 LAB — LIPID PANEL
Cholesterol: 177 mg/dL (ref <200)
HDL: 45 mg/dL (ref >=40)
LDL Cholesterol (Calc): 113 mg/dL (calc) — ABNORMAL HIGH
Non-HDL Cholesterol (Calc): 132 mg/dL (calc) — ABNORMAL HIGH (ref <130)
Total CHOL/HDL Ratio: 3.9 (calc) (ref <5.0)
Triglycerides: 88 mg/dL (ref <150)

## 2022-08-04 LAB — TSH: TSH: 0.99 mIU/L (ref 0.40–4.50)

## 2022-08-04 LAB — VITAMIN B12: Vitamin B-12: >2000 pg/mL — ABNORMAL HIGH (ref 200–1100)

## 2022-08-10 ENCOUNTER — Ambulatory Visit (INDEPENDENT_AMBULATORY_CARE_PROVIDER_SITE_OTHER): Payer: No Typology Code available for payment source | Admitting: Family Medicine

## 2022-08-10 ENCOUNTER — Encounter: Payer: Self-pay | Admitting: Family Medicine

## 2022-08-10 VITALS — BP 130/82 | HR 74 | Temp 98.0°F | Ht 71.5 in | Wt 208.4 lb

## 2022-08-10 DIAGNOSIS — Z0000 Encounter for general adult medical examination without abnormal findings: Secondary | ICD-10-CM | POA: Diagnosis not present

## 2022-08-10 DIAGNOSIS — F430 Acute stress reaction: Secondary | ICD-10-CM | POA: Insufficient documentation

## 2022-08-10 DIAGNOSIS — E538 Deficiency of other specified B group vitamins: Secondary | ICD-10-CM | POA: Diagnosis not present

## 2022-08-10 DIAGNOSIS — F17200 Nicotine dependence, unspecified, uncomplicated: Secondary | ICD-10-CM

## 2022-08-10 DIAGNOSIS — Z13220 Encounter for screening for lipoid disorders: Secondary | ICD-10-CM

## 2022-08-10 DIAGNOSIS — G4733 Obstructive sleep apnea (adult) (pediatric): Secondary | ICD-10-CM

## 2022-08-10 DIAGNOSIS — K760 Fatty (change of) liver, not elsewhere classified: Secondary | ICD-10-CM

## 2022-08-10 DIAGNOSIS — E039 Hypothyroidism, unspecified: Secondary | ICD-10-CM
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
## 2022-08-10 NOTE — Patient Instructions (Addendum)
Consider going back to your diet for weight loss  (or find something more realistic)  Then when you maintain you may have to strike a compromise   Try to get most of your carbohydrates from produce (with the exception of white potatoes)  Eat less bread/pasta/rice/snack foods/cereals/sweets and other items from the middle of the grocery store (processed carbs)  For cholesterol  Avoid red meat/ fried foods/ egg yolks/ fatty breakfast meats/ butter, cheese and high fat dairy/ and shellfish     Add some strength training to your routine, this is important for bone and brain health and can reduce your risk of falls and help your body use insulin properly and regulate weight  Light weights, exercise bands , and internet videos are a good way to start  Push ups/ sit ups  Yoga (chair or regular), machines , floor exercises or a gym with machines are also good options    Here is a handout on the pneumonia vaccine  If you want it let us know  A flu shot every fall is a good idea also  As a smoker you are higher risk for anything    I put in a referral for counseling -for stress reaction  If you don't get a call in 1-2 weeks let us know   When you are ready to quit smoking , - nicotrol inhaler, nicotine gum or a small piece of a patch

## 2022-08-12 NOTE — Assessment & Plan Note (Signed)
Waking up more often - ? If pressure needs to be adjusted He has gaines weight -disc how this plays ar role Is more tired as well  Encourage t/u with his slep disorders provider

## 2022-08-12 NOTE — Assessment & Plan Note (Signed)
Reviewed health habits including diet and exercise and skin cancer prevention Reviewed appropriate screening tests for age  Also reviewed health mt list, fam hx and immunization status , as well as social and family history   See HPI Labs reviewed and ordered Declines most vaccines- disc need for prevnar 20 given smoking-he plans to consider it later/info given in handout  Colonoscopy 12/2020 with 3 y recall Adopted- does not know fam hx  Disc need for smoking cessation and strategy for that  Very physical job/ encourage strength training when able High stress level and mood issues-ref done to counseling  (PHQ score of 8)   wants to avoid medication Needs adj of cpap as well for sleep and mood

## 2022-08-12 NOTE — Assessment & Plan Note (Signed)
Disc goals for lipids and reasons to control them Rev last labs with pt Rev low sat fat diet in detail LDL is up a bit to 113 HDL 45 Encourage strongly to work on diet (he eats high fat diet including lot of pizza)

## 2022-08-12 NOTE — Assessment & Plan Note (Signed)
Long work hours and lack of time for self care/rest and recreation are troubling for him  Also affects his physical health  Must continue working to pay bills/mortgage   Reviewed stressors/ coping techniques/symptoms/ support sources/ tx options and side effects in detail today Ref done for mental health counseling  He declines medication after trouble with it in the past  Encourage good self care and update if symptoms worsen

## 2022-08-12 NOTE — Assessment & Plan Note (Signed)
Disc in detail risks of smoking and possible outcomes including copd, vascular/ heart disease, cancer , respiratory and sinus infections  Pt voices understanding  Has quit cold [place] in the past  It is his crutch for stress and needs to find other coping mechanisms  Ref to counseling made

## 2022-08-12 NOTE — Assessment & Plan Note (Signed)
Pt has gained wt but his LFTs are in normal range this time

## 2022-08-12 NOTE — Assessment & Plan Note (Signed)
Hypothyroidism  Pt has no clinical changes No change in energy level/ hair or skin/ edema and no tremor Lab Results  Component Value Date   TSH 0.99 08/03/2022    Plan to continue levothyroxine 125 mcg daily

## 2022-08-12 NOTE — Assessment & Plan Note (Signed)
Lab Results  Component Value Date   VITAMINB12 >2,000 (H) 08/03/2022   Doing fine with 1000 mcg daily- able to cut dose or take less often

## 2022-09-25 ENCOUNTER — Ambulatory Visit (INDEPENDENT_AMBULATORY_CARE_PROVIDER_SITE_OTHER): Payer: No Typology Code available for payment source | Admitting: Clinical

## 2022-09-25 DIAGNOSIS — F411 Generalized anxiety disorder: Secondary | ICD-10-CM

## 2022-09-25 DIAGNOSIS — F1221 Cannabis dependence, in remission: Secondary | ICD-10-CM | POA: Diagnosis not present
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
## 2022-09-25 NOTE — Progress Notes (Unsigned)
                Jermell Holeman, LCSW 

## 2022-10-09 ENCOUNTER — Telehealth: Payer: Self-pay | Admitting: Family Medicine

## 2022-10-09 NOTE — Telephone Encounter (Signed)
error 

## 2022-10-16 ENCOUNTER — Ambulatory Visit (INDEPENDENT_AMBULATORY_CARE_PROVIDER_SITE_OTHER): Payer: No Typology Code available for payment source | Admitting: Clinical

## 2022-10-16 DIAGNOSIS — F411 Generalized anxiety disorder: Secondary | ICD-10-CM

## 2022-10-16 DIAGNOSIS — F1221 Cannabis dependence, in remission: Secondary | ICD-10-CM | POA: Diagnosis not present

## 2022-10-16 NOTE — Progress Notes (Signed)
[facility] Counselor/Therapist Progress Note  Patient ID: Noah [name], MRN: [id number]    Date: 10/16/22  Time Spent: 8:38  am - 9:31 am : 53 Minutes  Treatment Type: Individual Therapy.  Reported Symptoms: Patient reported irritability, restlessness, and frustration  Mental Status Exam: Appearance:  Neat and Well Groomed     Behavior: Appropriate  Motor: Normal  Speech/Language:  Clear and Coherent  Affect: Appropriate  Mood: normal  Thought process: normal  Thought content:   WNL  Sensory/Perceptual disturbances:   WNL  Orientation: oriented to person, place, and situation  Attention: Good  Concentration: Good  Memory: WNL  Fund of knowledge:  Good  Insight:   Good  Judgment:  Good  Impulse Control: Fair   Risk Assessment: Danger to Self:  No Patient denied current suicidal ideation  Self-injurious Behavior: No Danger to Others: No Patient denied current homicidal ideation Duty to Warn:no Physical Aggression / Violence:No  Access to Firearms a concern: No  Gang Involvement:No   Subjective:  Patient reported no changes since last session. Patient reported feeling frustrated at times. Patient reported after work he likes to remain active and participate in activities he enjoys. Patient reported difficulty balancing the activities he enjoys and spending time with his significant other. Patient stated, "I feel like I never get to relax". Patient stated, "I usually am in a good mood". Patient reported a historical diagnosis of ADHD and reported he was treated with medication as a child for treatment of ADHD. Patient stated, "I hated taking it" in response to the medications. Patient stated, "I don't want to be blah I guess" in regards to medications and reported concern that taking medications that would affect patient's ability to experience various emotions. Patient reported he is open to participation in therapy. Patient reported he did not like  participating in virtual sessions during the COVID pandemic and requires appointments after 4pm or on Fridays.    Interventions: Motivational Interviewing. Clinician conducted session in person at clinician's office at [facility]. Clinician reviewed diagnoses and treatment recommendations. Clinician provided psycho education related to diagnoses and treatment. Discussed a referral to another provider that can meet patient's scheduling needs. Patient agreed to a referral to another provider that can meet patient's scheduling needs.   Diagnosis:  Generalized anxiety disorder  Cannabis use disorder, severe, in sustained remission (HCC)   Plan: Patient is being referred to another provider that can meet patient's scheduling needs.               Doree Barthel, LCSW

## 2022-11-05 ENCOUNTER — Ambulatory Visit: Payer: No Typology Code available for payment source | Admitting: Licensed Clinical Social Worker

## 2022-11-20 ENCOUNTER — Other Ambulatory Visit: Payer: Self-pay | Admitting: Family Medicine

## 2023-05-15 ENCOUNTER — Ambulatory Visit: Payer: Self-pay | Admitting: Podiatry

## 2023-05-15 ENCOUNTER — Ambulatory Visit (INDEPENDENT_AMBULATORY_CARE_PROVIDER_SITE_OTHER): Payer: PRIVATE HEALTH INSURANCE

## 2023-05-15 ENCOUNTER — Encounter: Payer: Self-pay | Admitting: Podiatry

## 2023-05-15 DIAGNOSIS — M722 Plantar fascial fibromatosis: Secondary | ICD-10-CM

## 2023-05-15 MED ORDER — MELOXICAM 15 MG PO TABS: 15.0000 mg | ORAL_TABLET | Freq: Every day | ORAL | 3 refills | Status: DC

## 2023-05-15 MED ORDER — TRIAMCINOLONE ACETONIDE 40 MG/ML IJ SUSP
20.0000 mg | Freq: Once | INTRAMUSCULAR | Status: AC
  Administered 2023-05-15: 20 mg

## 2023-05-15 MED ORDER — METHYLPREDNISOLONE 4 MG PO TBPK: ORAL_TABLET | ORAL | 0 refills | Status: DC

## 2023-05-15 NOTE — Progress Notes (Signed)
He presents today chief complaint of plantar heel pain right.  He says been aching for about 5 months he does also note that he tore his Achilles about 2 years ago and was put in a boot for several weeks for it to heal.  He states that for the most part it does pretty well.  States that he has morning pain in the plantar heel he send is right in front of the heel bone as he points to the central plantar aspect of the foot just distal to the calcaneus.  He is currently only taking prescription of levothyroxine otherwise review of systems unremarkable. Objective: Pulses are palpable.  Vital signs are stable he is alert oriented x 3 right heel does demonstrate a palpable nodular mass which may be a fibroma or even just a plantar fascial lesion.  Radiographically does demonstrate what appears to be small soft tissue oval round area measuring about a centimeter and a half in diameter sitting on top of the plantar fascia just anterior to the calcaneal tubercle centrally.  This does appear to be soft tissue and inflammatory does not demonstrate any calcification.  Assessment: Probable plantar fasciitis or plantar fibromatosis.  Plan: I injected the lesion today 20 mg Kenalog 5 mg Marcaine point maximal tenderness.  Tolerated procedure well without complications.  Started him on methylprednisolone to be followed by meloxicam.  He has a cam boot at home which she will wear at nighttime.  I would like to follow-up with him in 1 month if not improved MRI will be necessary.

## 2023-06-19 ENCOUNTER — Ambulatory Visit (INDEPENDENT_AMBULATORY_CARE_PROVIDER_SITE_OTHER): Payer: PRIVATE HEALTH INSURANCE | Admitting: Podiatry

## 2023-06-19 ENCOUNTER — Encounter: Payer: Self-pay | Admitting: Podiatry

## 2023-06-19 DIAGNOSIS — M722 Plantar fascial fibromatosis: Secondary | ICD-10-CM | POA: Diagnosis not present

## 2023-06-19 NOTE — Progress Notes (Unsigned)
 He presents today for follow-up of his plantar fasciitis of his right foot.  He states that so much better but a little pain every now and again but continues to take his meloxicam on a regular basis and continues to wear his good skateboard shoes with good motion control.  Objective: Pulses are palpable no reproducible pain on palpation of the right heel.  Assessment: Planter fasciitis resolving of the right foot.  Plan: Instructed him to continue his oral medication until completely resolved.  Should this redevelop he is to evaluate his shoe gear start back on his meloxicam and notify us within a week if not improved.

## 2023-08-25 ENCOUNTER — Telehealth: Payer: Self-pay | Admitting: Family Medicine

## 2023-08-25 DIAGNOSIS — G4733 Obstructive sleep apnea (adult) (pediatric): Secondary | ICD-10-CM

## 2023-08-25 NOTE — Telephone Encounter (Signed)
 Having issues with sleep and sleep apnea   Per father's mychart message: Noah [name] is having difficulty getting a good night's sleep, wears his CPAP faithfully and I think a checkup would be a good idea. A good friend of mine sees Dr Tyrone Gallop and has needed unusual treatment and somewhat extensive workup with fabulous results. My faith in apnea local treatment is tenuous and would like to pursue Noah [name] being seen by Dr Tyrone Gallop. Info is as follows;   Merna Aase, MD [facility] [address] [place], [place] [address]     I will put in the referral

## 2023-08-26 ENCOUNTER — Encounter: Payer: Self-pay | Admitting: Family Medicine

## 2023-08-26 DIAGNOSIS — G4733 Obstructive sleep apnea (adult) (pediatric): Secondary | ICD-10-CM

## 2023-08-29 NOTE — Telephone Encounter (Signed)
 Please d/c prior pulm referral  One done today is active Thanks

## 2023-09-06 ENCOUNTER — Ambulatory Visit (INDEPENDENT_AMBULATORY_CARE_PROVIDER_SITE_OTHER): Admitting: Sleep Medicine

## 2023-09-06 ENCOUNTER — Encounter: Payer: Self-pay | Admitting: Sleep Medicine

## 2023-09-06 VITALS — BP 130/80 | HR 88 | Temp 98.4°F | Ht 72.0 in | Wt 223.4 lb

## 2023-09-06 DIAGNOSIS — E039 Hypothyroidism, unspecified: Secondary | ICD-10-CM

## 2023-09-06 DIAGNOSIS — E669 Obesity, unspecified: Secondary | ICD-10-CM

## 2023-09-06 DIAGNOSIS — F17210 Nicotine dependence, cigarettes, uncomplicated: Secondary | ICD-10-CM

## 2023-09-06 DIAGNOSIS — E66811 Obesity, class 1: Secondary | ICD-10-CM

## 2023-09-06 DIAGNOSIS — G4733 Obstructive sleep apnea (adult) (pediatric): Secondary | ICD-10-CM

## 2023-09-06 DIAGNOSIS — G4700 Insomnia, unspecified: Secondary | ICD-10-CM | POA: Diagnosis not present

## 2023-09-06 DIAGNOSIS — F5104 Psychophysiologic insomnia: Secondary | ICD-10-CM

## 2023-09-06 DIAGNOSIS — Z6830 Body mass index (BMI) 30.0-30.9, adult: Secondary | ICD-10-CM

## 2023-09-06 NOTE — Patient Instructions (Signed)
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
## 2023-09-28 ENCOUNTER — Other Ambulatory Visit: Payer: Self-pay | Admitting: Family Medicine

## 2023-09-30 NOTE — Telephone Encounter (Signed)
 Pt is overdue for CPE (labs prior). Please schedule then route back to me. Thanks

## 2023-10-01 NOTE — Telephone Encounter (Signed)
 Spoke to pt, sch cpe for 10/15/23

## 2023-10-15 ENCOUNTER — Encounter: Payer: Self-pay | Admitting: Family Medicine

## 2023-10-15 ENCOUNTER — Ambulatory Visit (INDEPENDENT_AMBULATORY_CARE_PROVIDER_SITE_OTHER): Admitting: Family Medicine

## 2023-10-15 VITALS — BP 116/72 | HR 79 | Temp 98.4°F | Ht 71.0 in | Wt 217.1 lb

## 2023-10-15 DIAGNOSIS — K635 Polyp of colon: Secondary | ICD-10-CM

## 2023-10-15 DIAGNOSIS — E538 Deficiency of other specified B group vitamins: Secondary | ICD-10-CM | POA: Diagnosis not present

## 2023-10-15 DIAGNOSIS — R062 Wheezing: Secondary | ICD-10-CM | POA: Insufficient documentation

## 2023-10-15 DIAGNOSIS — G4733 Obstructive sleep apnea (adult) (pediatric): Secondary | ICD-10-CM

## 2023-10-15 DIAGNOSIS — E039 Hypothyroidism, unspecified: Secondary | ICD-10-CM

## 2023-10-15 DIAGNOSIS — R0789 Other chest pain: Secondary | ICD-10-CM | POA: Insufficient documentation

## 2023-10-15 DIAGNOSIS — Z0000 Encounter for general adult medical examination without abnormal findings: Secondary | ICD-10-CM | POA: Diagnosis not present

## 2023-10-15 DIAGNOSIS — K760 Fatty (change of) liver, not elsewhere classified: Secondary | ICD-10-CM

## 2023-10-15 DIAGNOSIS — F17200 Nicotine dependence, unspecified, uncomplicated: Secondary | ICD-10-CM

## 2023-10-15 DIAGNOSIS — Z13220 Encounter for screening for lipoid disorders: Secondary | ICD-10-CM

## 2023-10-15 DIAGNOSIS — Z8659 Personal history of other mental and behavioral disorders: Secondary | ICD-10-CM

## 2023-10-15 MED ORDER — ALBUTEROL SULFATE HFA 108 (90 BASE) MCG/ACT IN AERS: 1.0000 | INHALATION_SPRAY | Freq: Four times a day (QID) | RESPIRATORY_TRACT | 5 refills | Status: AC | PRN

## 2023-10-15 NOTE — Assessment & Plan Note (Signed)
 Suspect allergic -primarily happens when mowing at his house  Other outdoor areas do  not bother him  Uses albuterol  if needed with mowing   Refilled today  Call back and Er precautions noted in detail today    Also counseled on smoking cessation

## 2023-10-15 NOTE — Assessment & Plan Note (Signed)
 Occational fleeting/ short lived sharp pain under left breast When this occurs-it is tender to touch so likely msk in nature  Normal exam today    May plan cxr at a later date / esp in light of smoking

## 2023-10-15 NOTE — Assessment & Plan Note (Signed)
 Current/ new settings are more tolerable, pt is still very tired (alcohol and long work hours may play into this) Plans to discuss inspire device with his pulmonologist at next appointment   Counseled on alcohol (cut back) and also weight loss to further help osa
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
## 2023-10-15 NOTE — Assessment & Plan Note (Signed)
 TSH today  Taking levothyroxine  125 mcg daily  Tired, but also has OSA and does not get enough hours to sleep

## 2023-10-15 NOTE — Assessment & Plan Note (Signed)
 PHQ was up today  Per pt -much of this is from fatigue due to long work hours and OSA (working on treatment) Also dislikes job and is actively looking for another one   Will continue to monitor

## 2023-10-15 NOTE — Assessment & Plan Note (Signed)
 Disc in detail risks of smoking and possible outcomes including copd, vascular/ heart disease, cancer , respiratory and sinus infections as well as osteoporosis  Pt voices understanding  Pt is not ready to quit yet  Thinks he will be soon

## 2023-10-15 NOTE — Assessment & Plan Note (Addendum)
 LFTs today  Gained weight-starting to loose again   Counseled on cutting alcohol as much as possible

## 2023-10-15 NOTE — Assessment & Plan Note (Signed)
 Oral supplementation -takes 1000-1500 mcg daily Lab today

## 2023-10-15 NOTE — Assessment & Plan Note (Signed)
 Recall colonoscopy will be 12/2023

## 2023-10-15 NOTE — Patient Instructions (Addendum)
 Keep thinking about cutting back alcohol for better sleep and calorie reduction  Try and work down to 1 or less drink per day    Keep thinking about quitting smoking    Start some vitamin D3 over the counter -take 2000 international units per day  This is for bone and overall health   Keep working on sleep with your pulmonologist  Let us  know if this does not improve   Keep working on healthier diet  Try to get most of your carbohydrates from produce (with the exception of white potatoes) and whole grains Eat less bread/pasta/rice/snack foods/cereals/sweets and other items from the middle of the grocery store (processed carbs)   Labs today   When they return - we will reach out and plan a chest xray

## 2023-10-15 NOTE — Assessment & Plan Note (Signed)
 Reviewed health habits including diet and exercise and skin cancer prevention Reviewed appropriate screening tests for age  Also reviewed health mt list, fam hx and immunization status , as well as social and family history   See HPI Labs reviewed and ordered Health Maintenance  Topic Date Due   Hepatitis B Vaccine (1 of 3 - 19+ 3-dose series) Never done   HPV Vaccine (1 - 3-dose SCDM series) Never done   Colon Cancer Screening  01/12/2024   Pneumococcal Vaccination (1 of 2 - PCV) 10/14/2024*   COVID-19 Vaccine (1 - 2024-25 season) 10/30/2025*   Flu Shot  11/15/2023   DTaP/Tdap/Td vaccine (3 - Td or Tdap) 07/23/2027   Hepatitis C Screening  Completed   HIV Screening  Completed   Meningitis B Vaccine  Aged Out  *Topic was postponed. The date shown is not the original due date.    Declines flu and pna shots despite smoking Declines std testing due to low risk  Counseled on smoking cessation  Counseled on etoh -needs to cut back (eventually quit) for multiple health reasons  No fam history-is adopted  Discussed fall prevention, supplements and exercise for bone density   Encouraged to start some vitamin D3 PHQ is elevated at 13- monitoring this/working on sleep with pulmonary and plans to cut alcohol  Wellness labs today

## 2023-10-16 ENCOUNTER — Ambulatory Visit: Payer: Self-pay | Admitting: Family Medicine

## 2023-10-16 DIAGNOSIS — R0789 Other chest pain: Secondary | ICD-10-CM

## 2023-10-16 LAB — CBC WITH DIFFERENTIAL/PLATELET
Basophils Absolute: 0.1 10*3/uL (ref 0.0–0.1)
Basophils Relative: 1.2 % (ref 0.0–3.0)
Eosinophils Absolute: 0.5 10*3/uL (ref 0.0–0.7)
Eosinophils Relative: 5.3 % — ABNORMAL HIGH (ref 0.0–5.0)
HCT: 43 % (ref 39.0–52.0)
Hemoglobin: 14.8 g/dL (ref 13.0–17.0)
Lymphocytes Relative: 31.7 % (ref 12.0–46.0)
Lymphs Abs: 2.8 10*3/uL (ref 0.7–4.0)
MCHC: 34.3 g/dL (ref 30.0–36.0)
MCV: 92.6 fl (ref 78.0–100.0)
Monocytes Absolute: 0.8 10*3/uL (ref 0.1–1.0)
Monocytes Relative: 8.5 % (ref 3.0–12.0)
Neutro Abs: 4.7 10*3/uL (ref 1.4–7.7)
Neutrophils Relative %: 53.3 % (ref 43.0–77.0)
Platelets: 212 10*3/uL (ref 150.0–400.0)
RBC: 4.65 Mil/uL (ref 4.22–5.81)
RDW: 13.2 % (ref 11.5–15.5)
WBC: 8.8 10*3/uL (ref 4.0–10.5)

## 2023-10-16 LAB — COMPREHENSIVE METABOLIC PANEL WITH GFR
ALT: 21 U/L (ref 0–53)
AST: 19 U/L (ref 0–37)
Albumin: 4.8 g/dL (ref 3.5–5.2)
Alkaline Phosphatase: 68 U/L (ref 39–117)
BUN: 14 mg/dL (ref 6–23)
CO2: 27 meq/L (ref 19–32)
Calcium: 9.5 mg/dL (ref 8.4–10.5)
Chloride: 104 meq/L (ref 96–112)
Creatinine, Ser: 0.91 mg/dL (ref 0.40–1.50)
GFR: 106.17 mL/min (ref >60.00)
Glucose, Bld: 83 mg/dL (ref 70–99)
Potassium: 4.1 meq/L (ref 3.5–5.1)
Sodium: 139 meq/L (ref 135–145)
Total Bilirubin: 1 mg/dL (ref 0.2–1.2)
Total Protein: 6.9 g/dL (ref 6.0–8.3)

## 2023-10-16 LAB — LIPID PANEL
Cholesterol: 179 mg/dL (ref 0–200)
HDL: 42.8 mg/dL (ref >39.00)
LDL Cholesterol: 115 mg/dL — ABNORMAL HIGH (ref 0–99)
NonHDL: 136.28
Total CHOL/HDL Ratio: 4
Triglycerides: 106 mg/dL (ref 0.0–149.0)
VLDL: 21.2 mg/dL (ref 0.0–40.0)

## 2023-10-16 LAB — VITAMIN B12: Vitamin B-12: 1071 pg/mL — ABNORMAL HIGH (ref 211–911)

## 2023-10-16 LAB — TSH: TSH: 3.06 u[IU]/mL (ref 0.35–5.50)

## 2023-11-20 ENCOUNTER — Ambulatory Visit (INDEPENDENT_AMBULATORY_CARE_PROVIDER_SITE_OTHER)

## 2023-11-20 ENCOUNTER — Ambulatory Visit: Payer: PRIVATE HEALTH INSURANCE | Admitting: Podiatry

## 2023-11-20 DIAGNOSIS — M7660 Achilles tendinitis, unspecified leg: Secondary | ICD-10-CM

## 2023-11-20 DIAGNOSIS — M7661 Achilles tendinitis, right leg: Secondary | ICD-10-CM

## 2023-11-20 DIAGNOSIS — S86011D Strain of right Achilles tendon, subsequent encounter: Secondary | ICD-10-CM | POA: Diagnosis not present
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
## 2023-11-21 ENCOUNTER — Encounter: Payer: Self-pay | Admitting: Podiatry

## 2023-11-22 ENCOUNTER — Telehealth: Payer: Self-pay | Admitting: Lab

## 2023-11-22 ENCOUNTER — Encounter: Payer: Self-pay | Admitting: Podiatry

## 2023-11-22 NOTE — Telephone Encounter (Signed)
 Patient is calling MRI was denied what will be the next step?

## 2023-11-22 NOTE — Telephone Encounter (Signed)
 Insurance denied MRI because patient needs documentation of 6 weeks of therapy. A P2P can be done,but must be done within 14 calendar days from date of letter which is 11/22/23.You can call [phone] reference Case Number [id number]

## 2023-11-23 ENCOUNTER — Other Ambulatory Visit

## 2023-11-26 ENCOUNTER — Telehealth: Payer: Self-pay | Admitting: Podiatry

## 2023-11-26 NOTE — Telephone Encounter (Signed)
 Patients mother states a letter was sent to our office from [facility].They are working with us  to get some needed information to consider covering the MRI. They are requesting patients signs and symptoms indicating the exam, prior diagnostics that is with results, prior management,medication with dose and duration. [phone]

## 2023-11-28 NOTE — Telephone Encounter (Signed)
 Spoke to patient and stated he would call us  back.

## 2023-12-03 ENCOUNTER — Ambulatory Visit
Admission: RE | Admit: 2023-12-03 | Discharge: 2023-12-03 | Disposition: A | Discharge status: Home / Self Care | Source: Ambulatory Visit | Attending: Interventional Radiology | Admitting: Interventional Radiology

## 2023-12-03 ENCOUNTER — Encounter: Payer: Self-pay | Admitting: Pediatric Hematology-Oncology

## 2023-12-03 ENCOUNTER — Other Ambulatory Visit: Payer: Self-pay | Admitting: Interventional Radiology

## 2023-12-03 DIAGNOSIS — Z0189 Encounter for other specified special examinations: Secondary | ICD-10-CM

## 2023-12-04 ENCOUNTER — Inpatient Hospital Stay
Admission: RE | Admit: 2023-12-04 | Discharge: 2023-12-04 | Discharge status: Home / Self Care | Source: Ambulatory Visit | Attending: Podiatry | Admitting: Podiatry

## 2023-12-04 DIAGNOSIS — S86011D Strain of right Achilles tendon, subsequent encounter: Secondary | ICD-10-CM

## 2023-12-06 ENCOUNTER — Encounter: Payer: Self-pay | Admitting: Sleep Medicine

## 2023-12-06 ENCOUNTER — Ambulatory Visit: Payer: Self-pay | Admitting: Sleep Medicine

## 2023-12-06 VITALS — BP 120/82 | HR 85 | Temp 98.0°F | Ht 71.0 in | Wt 218.6 lb

## 2023-12-06 DIAGNOSIS — Z6830 Body mass index (BMI) 30.0-30.9, adult: Secondary | ICD-10-CM | POA: Diagnosis not present

## 2023-12-06 DIAGNOSIS — E669 Obesity, unspecified: Secondary | ICD-10-CM | POA: Diagnosis not present

## 2023-12-06 DIAGNOSIS — G4733 Obstructive sleep apnea (adult) (pediatric): Secondary | ICD-10-CM

## 2023-12-06 DIAGNOSIS — Z9989 Dependence on other enabling machines and devices: Secondary | ICD-10-CM

## 2023-12-06 NOTE — Progress Notes (Signed)
 Name:Noah [name] MRN: [id number] DOB: 01/23/1986   CHIEF COMPLAINT:  CPAP F/U   HISTORY OF PRESENT ILLNESS: Noah [name] is a 38 y.o. w/ a h/o OSA, hypothyroidism and obesity who presents for CPAP F/U visit. Reports using CPAP therapy every night, which is confirmed by compliance data. He is currently using the Airfit F20 FFM, which is comfortable. Denies air leaks or nasal congestion. Patient reports feeling significantly more refreshed upon awakening with CPAP therapy.     EPWORTH SLEEP SCORE 8    09/06/2023    8:00 AM 02/05/2019    1:00 PM 12/01/2018    4:00 PM  Results of the Epworth flowsheet  Sitting and reading 3 0 0  Watching TV 2 3 1   Sitting, inactive in a public place (e.g. a theatre or a meeting) 0 0 1  As a passenger in a car for an hour without a break 0 3 2  Lying down to rest in the afternoon when circumstances permit 3 2 3   Sitting and talking to someone 0 0 0  Sitting quietly after a lunch without alcohol 3 3 1   In a car, while stopped for a few minutes in traffic 0 0 0  Total score 11 11 8      PAST MEDICAL HISTORY :   has a past medical history of Allergy, Condyloma (2009), and Depression.  has a past surgical history that includes Right arm fracture (01/2004) and Colonoscopy with propofol  (N/A, 01/11/2021). Prior to Admission medications   Medication Sig Start Date End Date Taking? Authorizing Provider  albuterol  (PROAIR  HFA) 108 (90 Base) MCG/ACT inhaler Inhale 1-2 puffs into the lungs every 6 (six) hours as needed for wheezing or shortness of breath. 10/15/23   Tower, Laine LABOR, MD  levothyroxine  (SYNTHROID ) 125 MCG tablet TAKE 1 TABLET BY MOUTH EVERY DAY BEFORE BREAKFAST 10/01/23   Tower, Laine LABOR, MD  vitamin B-12 (CYANOCOBALAMIN ) 1000 MCG tablet Take 1,000 mcg by mouth daily.    [provider]   Allergies  Allergen Reactions   Haloperidol Lactate     FAMILY HISTORY:  family history is not on file. He was adopted. SOCIAL  HISTORY:  reports that he has been smoking cigarettes. He started smoking about 24 years ago. He has a 37 pack-year smoking history. He has never used smokeless tobacco. He reports current alcohol use of about 4.0 standard drinks of alcohol per week. He reports that he does not use drugs.   Review of Systems:  Gen:  Denies  fever, sweats, chills weight loss  HEENT: Denies blurred vision, double vision, ear pain, eye pain, hearing loss, nose bleeds, sore throat Cardiac:  No dizziness, chest pain or heaviness, chest tightness,edema, No JVD Resp:   No cough, -sputum production, -shortness of breath,-wheezing, -hemoptysis,  Gi: Denies swallowing difficulty, stomach pain, nausea or vomiting, diarrhea, constipation, bowel incontinence Gu:  Denies bladder incontinence, burning urine Ext:   Denies Joint pain, stiffness or swelling Skin: Denies  skin rash, easy bruising or bleeding or hives Endoc:  Denies polyuria, polydipsia , polyphagia or weight change Psych:   Denies depression, insomnia or hallucinations  Other:  All other systems negative  VITAL SIGNS: BP 120/82   Pulse 85   Temp 98 F (36.7 C)   Ht 5' 11 (1.803 m)   Wt 218 lb 9.6 oz (99.2 kg)   SpO2 95%   BMI 30.49 kg/m     Physical Examination:   General Appearance: No  distress  EYES PERRLA, EOM intact.   NECK Supple, No JVD Pulmonary: normal breath sounds, No wheezing.  CardiovascularNormal S1,S2.  No m/r/g.   Abdomen: Benign, Soft, non-tender. Skin:   warm, no rashes, no ecchymosis  Extremities: normal, no cyanosis, clubbing. Neuro:without focal findings,  speech normal  PSYCHIATRIC: Mood, affect within normal limits.   ASSESSMENT AND PLAN  OSA Patient is using and benefiting from CPAP therapy. Discussed the consequences of untreated sleep apnea. Discussed the consequences of untreated sleep apnea. Advised not to drive drowsy for safety of patient and others. Will follow up in 6 weeks.    Obesity Counseled patient  on diet and lifestyle modification.    Patient  satisfied with Plan of action and management. All questions answered  I spent a total of 35 minutes reviewing chart data, face-to-face evaluation with the patient, counseling and coordination of care as detailed above.    Quenisha Lovins, M.D.  Sleep Medicine [facility]

## 2023-12-06 NOTE — Patient Instructions (Addendum)

## 2023-12-09 ENCOUNTER — Telehealth: Payer: Self-pay | Admitting: Lab

## 2023-12-09 ENCOUNTER — Encounter: Payer: Self-pay | Admitting: Podiatry

## 2023-12-09 NOTE — Telephone Encounter (Signed)
 Per Rosina SQUIBB ok to note 6 weeks out of work on note.

## 2023-12-09 NOTE — Telephone Encounter (Signed)
 I sent note to Dr. Verta and Rosina to confirm date to put in note for pt to be out of work. See prev notes of MRI was denied.

## 2023-12-09 NOTE — Telephone Encounter (Signed)
 Sent pt out of work letter via [employer] 01/27/24 depending on MRI results and plan of care.

## 2023-12-09 NOTE — Telephone Encounter (Signed)
 Patient is requesting to be out of work until his MRI is back and is able to get with you on care plan.

## 2023-12-10 ENCOUNTER — Encounter: Payer: Self-pay | Admitting: Podiatry

## 2023-12-11 ENCOUNTER — Ambulatory Visit: Payer: Self-pay | Admitting: Podiatry

## 2024-01-07 ENCOUNTER — Encounter: Payer: Self-pay | Admitting: Podiatry

## 2024-01-07 ENCOUNTER — Ambulatory Visit: Admitting: Podiatry

## 2024-01-07 DIAGNOSIS — M7661 Achilles tendinitis, right leg: Secondary | ICD-10-CM

## 2024-01-07 NOTE — Progress Notes (Signed)
 He presents today for follow-up of his MRI he states that the foot is feeling better but he really cannot tell if it is still sore or not because he has not been out of the boot.  States that he stepped incorrectly with the boot the other day causing the boot to dorsiflex and tweaked his ankle a little bit.  He states that all in all is feeling much better now and his heel is feeling better.  Objective: Vital signs are stable alert and oriented x 3 exam is relatively unremarkable has no reproducible pain with exception of 1 small area to the posterior lateral aspect of the calcaneus where appears to be a very small Haglund's deformity present.  MRI findings are negative.  Assessment: Resolving Achilles tendinitis tendinosis tendon injury.  Plan: I recommended that he get out of his cam boot over the next week.  If he has reoccurrence of pain he is to notify me at which time we will send him to physical therapy.  If physical therapy is not necessary then we will return to work.

## 2024-01-19 ENCOUNTER — Other Ambulatory Visit: Payer: Self-pay | Admitting: Family Medicine

## 2024-03-06 ENCOUNTER — Ambulatory Visit (INDEPENDENT_AMBULATORY_CARE_PROVIDER_SITE_OTHER): Admitting: Sleep Medicine

## 2024-03-06 ENCOUNTER — Encounter: Payer: Self-pay | Admitting: Sleep Medicine

## 2024-03-06 VITALS — BP 100/60 | HR 72 | Temp 98.8°F | Ht 72.0 in | Wt 220.7 lb

## 2024-03-06 DIAGNOSIS — Z6829 Body mass index (BMI) 29.0-29.9, adult: Secondary | ICD-10-CM

## 2024-03-06 DIAGNOSIS — F32A Depression, unspecified: Secondary | ICD-10-CM | POA: Diagnosis not present

## 2024-03-06 DIAGNOSIS — F5104 Psychophysiologic insomnia: Secondary | ICD-10-CM | POA: Diagnosis not present

## 2024-03-06 DIAGNOSIS — G4733 Obstructive sleep apnea (adult) (pediatric): Secondary | ICD-10-CM | POA: Diagnosis not present

## 2024-03-06 DIAGNOSIS — E669 Obesity, unspecified: Secondary | ICD-10-CM | POA: Diagnosis not present

## 2024-03-06 MED ORDER — TRAZODONE HCL 50 MG PO TABS: 50.0000 mg | ORAL_TABLET | Freq: Every day | ORAL | 3 refills | Status: DC

## 2024-03-06 NOTE — Patient Instructions (Addendum)
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
## 2024-03-09 ENCOUNTER — Ambulatory Visit: Admitting: Sleep Medicine

## 2024-03-20 ENCOUNTER — Ambulatory Visit: Payer: Self-pay

## 2024-03-20 NOTE — Telephone Encounter (Signed)
 FYI Only or Action Required?: FYI only for provider: appointment scheduled on 03/23/24.  Patient was last seen in primary care on 10/15/2023 by Noah Laine LABOR, MD.  Called Nurse Triage reporting Chest Pain.  Symptoms began several months ago.  Interventions attempted: Nothing.  Symptoms are: unchanged.  Triage Disposition: See PCP When Office is Open (Within 3 Days)  Patient/caregiver understands and will follow disposition?: Yes   Copied from CRM #[id number]. Topic: Clinical - Red Word Triage >> Mar 20, 2024 12:36 PM Noah [name] wrote: Red Word that prompted transfer to Nurse Triage:chest pains off and on for  a month  ----------------------------------------------------------------------- From previous Reason for Contact - Scheduling: Patient/patient representative is calling to schedule an appointment. Refer to attachments for appointment information.   Pt reports being current smoker; would like to discuss ways to stop  Reason for Disposition  [1] Chest pain(s) lasting a few seconds AND [2] persists > 3 days  Answer Assessment - Initial Assessment Questions Pt called in reporting intermittent L sided chest pain for past 2 months; reporting first occurrence was after eating dinner at a friends house. He states he does not eat red meat and his girlfriend was bitten by a tick resulting in alpha-gal. He states after dinner, he had chest pain, a rash and hives develop. He felt like he was having an allergic rxn and a heart attack, states after 2 hours and taking a Claritin, symptoms resolved. He has not ate red meat since event. Pt denies any current chest pain or distress. Denies reflux symptoms. Pt would like to schedule appt for lab monitor for alpha-gal and to discuss smoking cessation. Appointment scheduled for evaluation. Patient agrees with plan of care, and will call back if anything changes, or if symptoms worsen.   1. LOCATION: Where does it hurt?       L sternal chest pain    2. RADIATION: Does the pain go anywhere else? (e.g., into neck, jaw, arms, back)     None   3. ONSET: When did the chest pain begin? (Minutes, hours or days)      2 months ago; intermittent   4. PATTERN: Does the pain come and go, or has it been constant since it started?  Does it get worse with exertion?      Comes and goes  5. DURATION: How long does it last (e.g., seconds, minutes, hours)     A couple minutes; always L side, feels like a stabbing pain.   6. SEVERITY: How bad is the pain?  (e.g., Scale 1-10; mild, moderate, or severe)     Mild-moderate   9. CAUSE: What do you think is causing the chest pain?     Unknown; States 2 months ago he experienced chest pain for the first time. States he ate dinner at a friends house and is unsure if he had an allergic rxn; L sternal chest pain, hives, rash and elevated HR. I felt like I was having a heart attack Pt took claritin and resolved within a couple hours  10. OTHER SYMPTOMS: Do you have any other symptoms? (e.g., dizziness, nausea, vomiting, sweating, fever, difficulty breathing, cough)       None  Protocols used: Chest Pain-A-AH

## 2024-03-20 NOTE — Telephone Encounter (Signed)
 Will see patient then Agree with ER and UC precautions

## 2024-03-23 ENCOUNTER — Ambulatory Visit: Payer: Self-pay | Admitting: Family Medicine

## 2024-03-23 ENCOUNTER — Encounter: Payer: Self-pay | Admitting: Family Medicine

## 2024-03-23 VITALS — BP 132/80 | HR 93 | Temp 98.9°F | Ht 72.0 in | Wt 214.5 lb

## 2024-03-23 DIAGNOSIS — G4733 Obstructive sleep apnea (adult) (pediatric): Secondary | ICD-10-CM

## 2024-03-23 DIAGNOSIS — R079 Chest pain, unspecified: Secondary | ICD-10-CM | POA: Diagnosis not present

## 2024-03-23 DIAGNOSIS — F17200 Nicotine dependence, unspecified, uncomplicated: Secondary | ICD-10-CM

## 2024-03-23 DIAGNOSIS — E039 Hypothyroidism, unspecified: Secondary | ICD-10-CM

## 2024-03-23 DIAGNOSIS — F430 Acute stress reaction: Secondary | ICD-10-CM

## 2024-03-23 DIAGNOSIS — L509 Urticaria, unspecified: Secondary | ICD-10-CM | POA: Insufficient documentation

## 2024-03-23 LAB — TSH: TSH: 10.46 u[IU]/mL — ABNORMAL HIGH (ref 0.35–5.50)

## 2024-03-23 MED ORDER — VARENICLINE TARTRATE (STARTER) 0.5 MG X 11 & 1 MG X 42 PO TBPK: ORAL_TABLET | ORAL | 0 refills | Status: AC

## 2024-03-23 MED ORDER — NICOTINE POLACRILEX 4 MG MT GUM: 4.0000 mg | CHEWING_GUM | OROMUCOSAL | 1 refills | Status: AC | PRN
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
## 2024-03-23 NOTE — Patient Instructions (Addendum)
 Start the chantix  to try and quit smoking  If any mood change -hold and let us  know  You may have some vivid dreams   Also -I sent nicotine  gum to try in place of cigarettes if needed   Think about the Quit line-see the hand out   I put the referral in for cardiology  Please let us  know if you don't hear in 1-2 weeks to set that up (mychart message or call or letter)    Lab today for alpha gal  Also thyroid    Carry anti histamine with you Avoid mammalian meats

## 2024-03-23 NOTE — Assessment & Plan Note (Signed)
 Started during an episode of hives /during allergic reaction Now intermittent but more than once per day Atypical, not exertional No triggers  No corresponding symptoms  Reassuring exam  EKG- rate of 98 noted (computer read as a flutter potentially but I see P waves and rate looks fairly regular)   Smoker with OSA  Fair cholesterol in the summer   Refer to cardiology for further eval
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
## 2024-03-23 NOTE — Assessment & Plan Note (Signed)
 Tolerating cpap and feels better with this

## 2024-03-23 NOTE — Assessment & Plan Note (Signed)
 After eating tacos (beef) and indian rice with unfamiliar spices Also cp   Resolved with claritin Has not happened since Usually does not eat beef (girlfriend has alpha gal)   Alpha gal lab today

## 2024-03-23 NOTE — Assessment & Plan Note (Signed)
 No breathing complaints Is ready to quit but needs help   Sent chantix  (will watch mood closely as he has history of mental health issues)   Nicotine  gum also prn   Given hand out  Quit line for support

## 2024-03-23 NOTE — Assessment & Plan Note (Signed)
 Recent increase in HR / also cp TSH today  Levothyroxine  125 mcg daily  Missed a dose this week/aware

## 2024-03-24 ENCOUNTER — Ambulatory Visit: Attending: Cardiovascular Disease | Admitting: Cardiovascular Disease

## 2024-03-24 ENCOUNTER — Encounter: Payer: Self-pay | Admitting: Cardiovascular Disease

## 2024-03-24 VITALS — BP 120/92 | HR 85 | Resp 97 | Ht 72.0 in | Wt 213.5 lb

## 2024-03-24 DIAGNOSIS — R079 Chest pain, unspecified: Secondary | ICD-10-CM

## 2024-03-24 NOTE — Patient Instructions (Addendum)
Medication Instructions:  No changes  If you need a refill on your cardiac medications before your next appointment, please call your pharmacy.   Lab work: No new labs needed  Testing/Procedures: CT coronary calcium score.   - $99 out of pocket cost at the time of your test - Call [phone] to schedule at your convenience.  [facility] [address] , [place] [address]   Coronary CalciumScan A coronary calcium scan is an imaging test used to look for deposits of calcium and other fatty materials (plaques) in the inner lining of the blood vessels of the heart (coronary arteries). These deposits of calcium and plaques can partly clog and narrow the coronary arteries without producing any symptoms or warning signs. This puts a person at risk for a heart attack. This test can detect these deposits before symptoms develop. Tell a health care provider about: Any allergies you have. All medicines you are taking, including vitamins, herbs, eye drops, creams, and over-the-counter medicines. Any problems you or family members have had with anesthetic medicines. Any blood disorders you have. Any surgeries you have had. Any medical conditions you have. Whether you are pregnant or may be pregnant. What are the risks? Generally, this is a safe procedure. However, problems may occur, including: Harm to a pregnant woman and her unborn baby. This test involves the use of radiation. Radiation exposure can be dangerous to a pregnant woman and her unborn baby. If you are pregnant, you generally should not have this procedure done. Slight increase in the risk of cancer. This is because of the radiation involved in the test. What happens before the procedure? No preparation is needed for this procedure. What happens during the procedure? You will undress and remove any jewelry around your neck or chest. You will put on a hospital gown. Sticky  electrodes will be placed on your chest. The electrodes will be connected to an electrocardiogram (ECG) machine to record a tracing of the electrical activity of your heart. A CT scanner will take pictures of your heart. During this time, you will be asked to lie still and hold your breath for 2-3 seconds while a picture of your heart is being taken. The procedure may vary among health care providers and hospitals. What happens after the procedure? You can get dressed. You can return to your normal activities. It is up to you to get the results of your test. Ask your health care provider, or the department that is doing the test, when your results will be ready. Summary A coronary calcium scan is an imaging test used to look for deposits of calcium and other fatty materials (plaques) in the inner lining of the blood vessels of the heart (coronary arteries). Generally, this is a safe procedure. Tell your health care provider if you are pregnant or may be pregnant. No preparation is needed for this procedure. A CT scanner will take pictures of your heart. You can return to your normal activities after the scan is done. This information is not intended to replace advice given to you by your health care provider. Make sure you discuss any questions you have with your health care provider. Document Released: 09/29/2007 Document Revised: 02/20/2016 Document Reviewed: 02/20/2016 Elsevier Interactive Patient Education  2017 Elsevier Inc.  Follow-Up: At [facility], you and your health needs are our priority.  As part of our continuing mission to provide you with exceptional heart care, we have created designated Provider Care   Teams.  These Care Teams include your primary Cardiologist (physician) and Advanced Practice Providers (APPs -  Physician Assistants and Nurse Practitioners) who all work together to provide you with the care you need, when you need it.  You will need a follow up appointment as  needed  Providers on your designated Care Team:   Christopher Berge, NP Ryan Dunn, PA-C Cadence Furth, PA-C  COVID-19 Vaccine Information can be found at: [email or web address] For questions related to vaccine distribution or appointments, please email [email or web address] or call [phone].   
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
## 2024-03-27 LAB — ALPHA-GAL PANEL
Allergen, Mutton, f88: 3.84 kU/L — ABNORMAL HIGH
Allergen, Pork, f26: 3.91 kU/L — ABNORMAL HIGH
Beef: 6.24 kU/L — ABNORMAL HIGH
CLASS: 3
CLASS: 3
Class: 3
GALACTOSE-ALPHA-1,3-GALACTOSE IGE*: 14.6 kU/L — ABNORMAL HIGH (ref <0.10)

## 2024-03-27 LAB — INTERPRETATION:

## 2024-03-29 DIAGNOSIS — Z91014 Allergy to mammalian meats: Secondary | ICD-10-CM | POA: Insufficient documentation

## 2024-03-30 ENCOUNTER — Ambulatory Visit
Admission: RE | Admit: 2024-03-30 | Discharge: 2024-03-30 | Disposition: A | Payer: Self-pay | Source: Ambulatory Visit | Attending: Cardiovascular Disease | Admitting: Cardiovascular Disease

## 2024-03-30 DIAGNOSIS — R079 Chest pain, unspecified: Secondary | ICD-10-CM

## 2024-03-31 NOTE — Telephone Encounter (Signed)
 Copied from CRM #[id number]. Topic: Clinical - Lab/Test Results >> Mar 31, 2024 10:43 AM Maisie BROCKS wrote: Reason for CRM: pt called back in regards to results. He stated he is ok to see an allergist. Please call patient back and advise.

## 2024-04-12 ENCOUNTER — Ambulatory Visit: Payer: Self-pay | Admitting: Cardiovascular Disease

## 2024-04-20 ENCOUNTER — Encounter: Payer: Self-pay | Admitting: Emergency Medicine

## 2024-04-21 ENCOUNTER — Other Ambulatory Visit (INDEPENDENT_AMBULATORY_CARE_PROVIDER_SITE_OTHER): Payer: Self-pay

## 2024-04-21 ENCOUNTER — Ambulatory Visit: Payer: Self-pay | Admitting: Family Medicine

## 2024-04-21 DIAGNOSIS — E039 Hypothyroidism, unspecified: Secondary | ICD-10-CM | POA: Diagnosis not present

## 2024-04-21 LAB — TSH: TSH: 1.83 u[IU]/mL (ref 0.35–5.50)
